# Patient Record
Sex: Female | Born: 1940 | Race: Black or African American | Hispanic: No | Smoking: Never smoker
Health system: Southern US, Community
[De-identification: ages and names within clinical notes are randomized; demographics above are authoritative.]

## PROBLEM LIST (undated history)

## (undated) DIAGNOSIS — E039 Hypothyroidism, unspecified: Secondary | ICD-10-CM

## (undated) DIAGNOSIS — M797 Fibromyalgia: Secondary | ICD-10-CM

## (undated) DIAGNOSIS — I10 Essential (primary) hypertension: Secondary | ICD-10-CM

## (undated) DIAGNOSIS — E785 Hyperlipidemia, unspecified: Secondary | ICD-10-CM

## (undated) HISTORY — PX: COLON SURGERY: SHX602

## (undated) HISTORY — PX: BACK SURGERY: SHX140

## (undated) HISTORY — PX: THYROIDECTOMY: SHX17

## (undated) HISTORY — PX: ABDOMINAL HYSTERECTOMY: SHX81

## (undated) HISTORY — PX: CARPAL TUNNEL RELEASE: SHX101

---

## 1997-03-23 ENCOUNTER — Ambulatory Visit (HOSPITAL_COMMUNITY): Admission: RE | Admit: 1997-03-23 | Discharge: 1997-03-23 | Payer: Self-pay | Admitting: *Deleted

## 1997-05-01 ENCOUNTER — Other Ambulatory Visit: Admission: RE | Admit: 1997-05-01 | Discharge: 1997-05-01 | Payer: Self-pay | Admitting: Obstetrics and Gynecology

## 1997-06-02 ENCOUNTER — Ambulatory Visit (HOSPITAL_COMMUNITY): Admission: RE | Admit: 1997-06-02 | Discharge: 1997-06-02 | Payer: Self-pay | Admitting: *Deleted

## 1997-12-12 ENCOUNTER — Encounter: Payer: Self-pay | Admitting: Neurosurgery

## 1997-12-14 ENCOUNTER — Inpatient Hospital Stay (HOSPITAL_COMMUNITY): Admission: RE | Admit: 1997-12-14 | Discharge: 1997-12-18 | Payer: Self-pay | Admitting: Neurosurgery

## 1997-12-14 ENCOUNTER — Encounter: Payer: Self-pay | Admitting: Neurosurgery

## 1998-02-13 ENCOUNTER — Ambulatory Visit (HOSPITAL_COMMUNITY): Admission: RE | Admit: 1998-02-13 | Discharge: 1998-02-13 | Payer: Self-pay | Admitting: *Deleted

## 1998-02-19 ENCOUNTER — Ambulatory Visit (HOSPITAL_COMMUNITY): Admission: RE | Admit: 1998-02-19 | Discharge: 1998-02-19 | Payer: Self-pay | Admitting: *Deleted

## 1998-02-19 ENCOUNTER — Encounter: Payer: Self-pay | Admitting: *Deleted

## 1998-03-27 ENCOUNTER — Encounter: Payer: Self-pay | Admitting: Obstetrics and Gynecology

## 1998-03-27 ENCOUNTER — Ambulatory Visit (HOSPITAL_COMMUNITY): Admission: RE | Admit: 1998-03-27 | Discharge: 1998-03-27 | Payer: Self-pay | Admitting: Obstetrics and Gynecology

## 1998-04-11 ENCOUNTER — Encounter: Payer: Self-pay | Admitting: *Deleted

## 1998-04-11 ENCOUNTER — Ambulatory Visit (HOSPITAL_COMMUNITY): Admission: RE | Admit: 1998-04-11 | Discharge: 1998-04-11 | Payer: Self-pay | Admitting: *Deleted

## 1998-07-16 ENCOUNTER — Encounter: Payer: Self-pay | Admitting: *Deleted

## 1998-07-16 ENCOUNTER — Ambulatory Visit (HOSPITAL_COMMUNITY): Admission: RE | Admit: 1998-07-16 | Discharge: 1998-07-16 | Payer: Self-pay | Admitting: *Deleted

## 1998-07-27 ENCOUNTER — Ambulatory Visit (HOSPITAL_COMMUNITY): Admission: RE | Admit: 1998-07-27 | Discharge: 1998-07-27 | Payer: Self-pay | Admitting: *Deleted

## 1998-09-03 ENCOUNTER — Encounter: Payer: Self-pay | Admitting: Emergency Medicine

## 1998-09-03 ENCOUNTER — Emergency Department (HOSPITAL_COMMUNITY): Admission: EM | Admit: 1998-09-03 | Discharge: 1998-09-03 | Payer: Self-pay | Admitting: Emergency Medicine

## 1998-09-07 ENCOUNTER — Ambulatory Visit (HOSPITAL_COMMUNITY): Admission: RE | Admit: 1998-09-07 | Discharge: 1998-09-07 | Payer: Self-pay | Admitting: *Deleted

## 1998-09-10 ENCOUNTER — Encounter: Payer: Self-pay | Admitting: *Deleted

## 1998-09-10 ENCOUNTER — Ambulatory Visit (HOSPITAL_COMMUNITY): Admission: RE | Admit: 1998-09-10 | Discharge: 1998-09-10 | Payer: Self-pay | Admitting: *Deleted

## 1998-10-09 ENCOUNTER — Ambulatory Visit (HOSPITAL_COMMUNITY): Admission: RE | Admit: 1998-10-09 | Discharge: 1998-10-09 | Payer: Self-pay | Admitting: *Deleted

## 2000-02-05 ENCOUNTER — Encounter: Payer: Self-pay | Admitting: Family Medicine

## 2000-02-05 ENCOUNTER — Ambulatory Visit (HOSPITAL_COMMUNITY): Admission: RE | Admit: 2000-02-05 | Discharge: 2000-02-05 | Payer: Self-pay | Admitting: Family Medicine

## 2000-02-19 ENCOUNTER — Encounter: Payer: Self-pay | Admitting: Family Medicine

## 2000-02-19 ENCOUNTER — Ambulatory Visit (HOSPITAL_COMMUNITY): Admission: RE | Admit: 2000-02-19 | Discharge: 2000-02-19 | Payer: Self-pay | Admitting: Family Medicine

## 2000-03-04 ENCOUNTER — Encounter: Payer: Self-pay | Admitting: Family Medicine

## 2000-03-04 ENCOUNTER — Ambulatory Visit (HOSPITAL_COMMUNITY): Admission: RE | Admit: 2000-03-04 | Discharge: 2000-03-04 | Payer: Self-pay | Admitting: Family Medicine

## 2000-03-24 ENCOUNTER — Encounter: Admission: RE | Admit: 2000-03-24 | Discharge: 2000-03-24 | Payer: Self-pay | Admitting: Cardiology

## 2000-03-24 ENCOUNTER — Encounter: Payer: Self-pay | Admitting: Cardiology

## 2000-05-02 ENCOUNTER — Encounter: Payer: Self-pay | Admitting: Emergency Medicine

## 2000-05-02 ENCOUNTER — Emergency Department (HOSPITAL_COMMUNITY): Admission: EM | Admit: 2000-05-02 | Discharge: 2000-05-02 | Payer: Self-pay | Admitting: Emergency Medicine

## 2000-07-21 ENCOUNTER — Emergency Department (HOSPITAL_COMMUNITY): Admission: EM | Admit: 2000-07-21 | Discharge: 2000-07-21 | Payer: Self-pay | Admitting: Emergency Medicine

## 2000-07-21 ENCOUNTER — Encounter: Payer: Self-pay | Admitting: Emergency Medicine

## 2000-09-04 ENCOUNTER — Ambulatory Visit (HOSPITAL_COMMUNITY): Admission: RE | Admit: 2000-09-04 | Discharge: 2000-09-04 | Payer: Self-pay | Admitting: *Deleted

## 2000-09-08 ENCOUNTER — Ambulatory Visit (HOSPITAL_COMMUNITY): Admission: RE | Admit: 2000-09-08 | Discharge: 2000-09-08 | Payer: Self-pay | Admitting: *Deleted

## 2000-12-20 ENCOUNTER — Encounter: Payer: Self-pay | Admitting: Emergency Medicine

## 2000-12-20 ENCOUNTER — Inpatient Hospital Stay (HOSPITAL_COMMUNITY): Admission: EM | Admit: 2000-12-20 | Discharge: 2000-12-25 | Payer: Self-pay | Admitting: Emergency Medicine

## 2001-05-19 ENCOUNTER — Encounter (HOSPITAL_BASED_OUTPATIENT_CLINIC_OR_DEPARTMENT_OTHER): Payer: Self-pay | Admitting: General Surgery

## 2001-05-19 ENCOUNTER — Ambulatory Visit (HOSPITAL_COMMUNITY): Admission: RE | Admit: 2001-05-19 | Discharge: 2001-05-19 | Payer: Self-pay | Admitting: General Surgery

## 2001-07-12 ENCOUNTER — Inpatient Hospital Stay (HOSPITAL_COMMUNITY): Admission: RE | Admit: 2001-07-12 | Discharge: 2001-07-22 | Payer: Self-pay | Admitting: General Surgery

## 2001-07-12 ENCOUNTER — Encounter (INDEPENDENT_AMBULATORY_CARE_PROVIDER_SITE_OTHER): Payer: Self-pay | Admitting: Specialist

## 2001-07-12 ENCOUNTER — Encounter (HOSPITAL_BASED_OUTPATIENT_CLINIC_OR_DEPARTMENT_OTHER): Payer: Self-pay | Admitting: General Surgery

## 2001-10-15 ENCOUNTER — Ambulatory Visit (HOSPITAL_COMMUNITY): Admission: RE | Admit: 2001-10-15 | Discharge: 2001-10-15 | Payer: Self-pay | Admitting: Cardiology

## 2001-10-15 ENCOUNTER — Encounter: Payer: Self-pay | Admitting: Cardiology

## 2001-12-04 ENCOUNTER — Emergency Department (HOSPITAL_COMMUNITY): Admission: EM | Admit: 2001-12-04 | Discharge: 2001-12-04 | Payer: Self-pay

## 2001-12-20 ENCOUNTER — Encounter: Payer: Self-pay | Admitting: Cardiology

## 2001-12-20 ENCOUNTER — Encounter: Admission: RE | Admit: 2001-12-20 | Discharge: 2001-12-20 | Payer: Self-pay | Admitting: Cardiology

## 2002-07-29 ENCOUNTER — Encounter: Payer: Self-pay | Admitting: Cardiology

## 2002-07-29 ENCOUNTER — Ambulatory Visit (HOSPITAL_COMMUNITY): Admission: RE | Admit: 2002-07-29 | Discharge: 2002-07-29 | Payer: Self-pay | Admitting: Cardiology

## 2002-08-09 ENCOUNTER — Ambulatory Visit (HOSPITAL_COMMUNITY): Admission: RE | Admit: 2002-08-09 | Discharge: 2002-08-09 | Payer: Self-pay | Admitting: Cardiology

## 2002-10-16 ENCOUNTER — Encounter: Payer: Self-pay | Admitting: Emergency Medicine

## 2002-10-16 ENCOUNTER — Emergency Department (HOSPITAL_COMMUNITY): Admission: EM | Admit: 2002-10-16 | Discharge: 2002-10-16 | Payer: Self-pay | Admitting: Emergency Medicine

## 2002-11-07 ENCOUNTER — Ambulatory Visit (HOSPITAL_COMMUNITY): Admission: RE | Admit: 2002-11-07 | Discharge: 2002-11-07 | Payer: Self-pay | Admitting: Urology

## 2002-11-07 ENCOUNTER — Encounter: Payer: Self-pay | Admitting: Urology

## 2003-05-22 ENCOUNTER — Encounter: Admission: RE | Admit: 2003-05-22 | Discharge: 2003-05-22 | Payer: Self-pay | Admitting: Cardiology

## 2003-05-24 ENCOUNTER — Encounter: Admission: RE | Admit: 2003-05-24 | Discharge: 2003-05-24 | Payer: Self-pay | Admitting: *Deleted

## 2003-10-04 ENCOUNTER — Encounter: Admission: RE | Admit: 2003-10-04 | Discharge: 2003-10-04 | Payer: Self-pay | Admitting: Cardiology

## 2003-10-06 ENCOUNTER — Emergency Department (HOSPITAL_COMMUNITY): Admission: EM | Admit: 2003-10-06 | Discharge: 2003-10-06 | Payer: Self-pay | Admitting: Emergency Medicine

## 2004-06-20 ENCOUNTER — Encounter: Admission: RE | Admit: 2004-06-20 | Discharge: 2004-06-20 | Payer: Self-pay | Admitting: Cardiology

## 2004-07-11 ENCOUNTER — Ambulatory Visit (HOSPITAL_COMMUNITY): Admission: RE | Admit: 2004-07-11 | Discharge: 2004-07-11 | Payer: Self-pay | Admitting: Gastroenterology

## 2004-10-09 ENCOUNTER — Encounter: Payer: Self-pay | Admitting: Cardiology

## 2004-10-09 ENCOUNTER — Observation Stay (HOSPITAL_COMMUNITY): Admission: EM | Admit: 2004-10-09 | Discharge: 2004-10-11 | Payer: Self-pay | Admitting: Emergency Medicine

## 2005-03-19 ENCOUNTER — Emergency Department (HOSPITAL_COMMUNITY): Admission: EM | Admit: 2005-03-19 | Discharge: 2005-03-19 | Payer: Self-pay | Admitting: Emergency Medicine

## 2005-07-31 ENCOUNTER — Encounter: Admission: RE | Admit: 2005-07-31 | Discharge: 2005-07-31 | Payer: Self-pay | Admitting: Cardiology

## 2005-10-07 ENCOUNTER — Encounter: Admission: RE | Admit: 2005-10-07 | Discharge: 2005-10-07 | Payer: Self-pay | Admitting: Cardiology

## 2005-10-20 ENCOUNTER — Encounter (HOSPITAL_COMMUNITY): Admission: RE | Admit: 2005-10-20 | Discharge: 2005-10-23 | Payer: Self-pay | Admitting: Cardiology

## 2005-11-11 ENCOUNTER — Ambulatory Visit (HOSPITAL_COMMUNITY): Admission: RE | Admit: 2005-11-11 | Discharge: 2005-11-11 | Payer: Self-pay | Admitting: Cardiology

## 2006-02-14 ENCOUNTER — Encounter: Admission: RE | Admit: 2006-02-14 | Discharge: 2006-02-14 | Payer: Self-pay | Admitting: Family Medicine

## 2006-03-10 ENCOUNTER — Ambulatory Visit (HOSPITAL_COMMUNITY): Admission: RE | Admit: 2006-03-10 | Discharge: 2006-03-11 | Payer: Self-pay | Admitting: Orthopaedic Surgery

## 2006-04-28 ENCOUNTER — Emergency Department (HOSPITAL_COMMUNITY): Admission: EM | Admit: 2006-04-28 | Discharge: 2006-04-28 | Payer: Self-pay | Admitting: Emergency Medicine

## 2006-08-13 ENCOUNTER — Encounter: Admission: RE | Admit: 2006-08-13 | Discharge: 2006-08-13 | Payer: Self-pay | Admitting: Cardiology

## 2007-02-06 ENCOUNTER — Emergency Department (HOSPITAL_COMMUNITY): Admission: EM | Admit: 2007-02-06 | Discharge: 2007-02-06 | Payer: Self-pay | Admitting: Emergency Medicine

## 2007-02-19 ENCOUNTER — Inpatient Hospital Stay (HOSPITAL_COMMUNITY): Admission: RE | Admit: 2007-02-19 | Discharge: 2007-02-24 | Payer: Self-pay | Admitting: Orthopaedic Surgery

## 2007-02-25 ENCOUNTER — Emergency Department (HOSPITAL_COMMUNITY): Admission: EM | Admit: 2007-02-25 | Discharge: 2007-02-25 | Payer: Self-pay | Admitting: Emergency Medicine

## 2007-07-29 ENCOUNTER — Emergency Department (HOSPITAL_COMMUNITY): Admission: EM | Admit: 2007-07-29 | Discharge: 2007-07-29 | Payer: Self-pay | Admitting: Emergency Medicine

## 2007-08-16 ENCOUNTER — Encounter: Admission: RE | Admit: 2007-08-16 | Discharge: 2007-08-16 | Payer: Self-pay | Admitting: Cardiology

## 2007-09-30 ENCOUNTER — Emergency Department (HOSPITAL_COMMUNITY): Admission: EM | Admit: 2007-09-30 | Discharge: 2007-09-30 | Payer: Self-pay | Admitting: Emergency Medicine

## 2007-12-08 ENCOUNTER — Encounter: Admission: RE | Admit: 2007-12-08 | Discharge: 2007-12-08 | Payer: Self-pay | Admitting: Cardiology

## 2008-08-16 ENCOUNTER — Encounter: Admission: RE | Admit: 2008-08-16 | Discharge: 2008-08-16 | Payer: Self-pay | Admitting: Cardiology

## 2009-03-06 ENCOUNTER — Ambulatory Visit (HOSPITAL_COMMUNITY): Admission: RE | Admit: 2009-03-06 | Discharge: 2009-03-06 | Payer: Self-pay | Admitting: Orthopaedic Surgery

## 2009-08-27 ENCOUNTER — Encounter: Admission: RE | Admit: 2009-08-27 | Discharge: 2009-08-27 | Payer: Self-pay | Admitting: Cardiology

## 2010-06-17 ENCOUNTER — Ambulatory Visit
Admission: RE | Admit: 2010-06-17 | Discharge: 2010-06-17 | Disposition: A | Discharge status: Home / Self Care | Payer: Medicare Other | Source: Ambulatory Visit | Attending: Cardiology | Admitting: Cardiology

## 2010-06-17 ENCOUNTER — Other Ambulatory Visit: Payer: Self-pay | Admitting: Cardiology

## 2010-06-17 DIAGNOSIS — R0602 Shortness of breath: Secondary | ICD-10-CM

## 2010-06-22 ENCOUNTER — Inpatient Hospital Stay (HOSPITAL_COMMUNITY)
Admission: EM | Admit: 2010-06-22 | Discharge: 2010-06-28 | DRG: 195 | Disposition: A | Discharge status: Home / Self Care | Payer: Medicare Other | Attending: Cardiology | Admitting: Cardiology

## 2010-06-22 ENCOUNTER — Emergency Department (HOSPITAL_COMMUNITY): Payer: Medicare Other

## 2010-06-22 DIAGNOSIS — E119 Type 2 diabetes mellitus without complications: Secondary | ICD-10-CM | POA: Diagnosis present

## 2010-06-22 DIAGNOSIS — J189 Pneumonia, unspecified organism: Principal | ICD-10-CM | POA: Diagnosis present

## 2010-06-22 DIAGNOSIS — Z7902 Long term (current) use of antithrombotics/antiplatelets: Secondary | ICD-10-CM

## 2010-06-22 DIAGNOSIS — D638 Anemia in other chronic diseases classified elsewhere: Secondary | ICD-10-CM | POA: Diagnosis present

## 2010-06-22 DIAGNOSIS — N189 Chronic kidney disease, unspecified: Secondary | ICD-10-CM | POA: Diagnosis present

## 2010-06-22 DIAGNOSIS — D509 Iron deficiency anemia, unspecified: Secondary | ICD-10-CM | POA: Diagnosis present

## 2010-06-22 DIAGNOSIS — I2510 Atherosclerotic heart disease of native coronary artery without angina pectoris: Secondary | ICD-10-CM | POA: Diagnosis present

## 2010-06-22 DIAGNOSIS — Z79899 Other long term (current) drug therapy: Secondary | ICD-10-CM

## 2010-06-22 DIAGNOSIS — D869 Sarcoidosis, unspecified: Secondary | ICD-10-CM | POA: Diagnosis present

## 2010-06-22 LAB — DIFFERENTIAL
Eosinophils Absolute: 0.1 10*3/uL (ref 0.0–0.7)
Eosinophils Relative: 1 % (ref 0–5)
Lymphs Abs: 3.6 10*3/uL (ref 0.7–4.0)
Monocytes Absolute: 0.6 10*3/uL (ref 0.1–1.0)
Monocytes Relative: 6 % (ref 3–12)

## 2010-06-22 LAB — CBC
HCT: 34.1 % — ABNORMAL LOW (ref 36.0–46.0)
MCH: 26.2 pg (ref 26.0–34.0)
MCV: 83.4 fL (ref 78.0–100.0)
Platelets: 318 10*3/uL (ref 150–400)
RDW: 15.8 % — ABNORMAL HIGH (ref 11.5–15.5)

## 2010-06-22 LAB — BASIC METABOLIC PANEL
BUN: 16 mg/dL (ref 6–23)
CO2: 28 mEq/L (ref 19–32)
Chloride: 103 mEq/L (ref 96–112)
Creatinine, Ser: 1.08 mg/dL (ref 0.4–1.2)
Potassium: 4 mEq/L (ref 3.5–5.1)

## 2010-06-24 LAB — BASIC METABOLIC PANEL
BUN: 21 mg/dL (ref 6–23)
Chloride: 108 mEq/L (ref 96–112)
Creatinine, Ser: 0.91 mg/dL (ref 0.4–1.2)
Glucose, Bld: 101 mg/dL — ABNORMAL HIGH (ref 70–99)
Potassium: 3.6 mEq/L (ref 3.5–5.1)

## 2010-06-24 LAB — CBC
HCT: 31.3 % — ABNORMAL LOW (ref 36.0–46.0)
Hemoglobin: 9.6 g/dL — ABNORMAL LOW (ref 12.0–15.0)
MCHC: 30.7 g/dL (ref 30.0–36.0)
MCV: 82.8 fL (ref 78.0–100.0)
RDW: 16 % — ABNORMAL HIGH (ref 11.5–15.5)
WBC: 15.4 10*3/uL — ABNORMAL HIGH (ref 4.0–10.5)

## 2010-06-25 LAB — BASIC METABOLIC PANEL
BUN: 20 mg/dL (ref 6–23)
Calcium: 9 mg/dL (ref 8.4–10.5)
Creatinine, Ser: 0.88 mg/dL (ref 0.4–1.2)
GFR calc non Af Amer: >60 mL/min (ref >60)
Glucose, Bld: 91 mg/dL (ref 70–99)
Potassium: 3.8 mEq/L (ref 3.5–5.1)

## 2010-06-25 LAB — CBC
HCT: 31.7 % — ABNORMAL LOW (ref 36.0–46.0)
Hemoglobin: 9.9 g/dL — ABNORMAL LOW (ref 12.0–15.0)
MCH: 26.2 pg (ref 26.0–34.0)
MCHC: 31.2 g/dL (ref 30.0–36.0)
MCV: 83.9 fL (ref 78.0–100.0)
RDW: 16.3 % — ABNORMAL HIGH (ref 11.5–15.5)

## 2010-06-25 NOTE — Op Note (Signed)
Stephanie Mcdaniel, Stephanie Mcdaniel                ACCOUNT NO.:  [id number]   MEDICAL RECORD NO.:  [id number]          PATIENT TYPE:  INP   LOCATION:  5034                         FACILITY:  [facility]   PHYSICIAN:  Vanita Panda. Magnus Ivan, M.D.DATE OF BIRTH:  12-Jun-1940   DATE OF PROCEDURE:  02/19/2007  DATE OF DISCHARGE:                               OPERATIVE REPORT   PREOPERATIVE DIAGNOSIS:  Severe tricompartmental osteoarthritis, left  knee.   POSTOPERATIVE DIAGNOSIS:  Severe tricompartmental osteoarthritis, left  knee.   PROCEDURE:  Left total knee arthroplasty using computer navigation.   IMPLANTS:  DePuy rotating platform Sigma knee with size 3 femur, size 3  tibial tray, 15-mm polyethylene insert, 35-mm patellar button.   SURGEON:  Doneen Poisson, M.D.   ASSISTANT:  Maud Deed, P.A.-C.   ANESTHESIA:  1. Left femoral block.  2. General.   ANTIBIOTICS:  2 grams IV Ancef.   TOURNIQUET TIME:  1 hour and 30 minutes.   BLOOD LOSS:  250 mL.   COMPLICATIONS:  None.   INDICATIONS:  Briefly, Ms. Stephanie Mcdaniel is a 70 year old female that I have  followed for over a year now.  She has debilitating arthritis involving  her left knee.  We have tried anti-inflammatories and then a series of  injections in her knee.  It has gotten to where it was affecting her  activities of daily living, and she wished to proceed with total knee  arthroplasty.  The risks and benefits of this were explained to her in  length including the risk of blood clots and fatal PE.  She agreed to  proceed with surgery.   After informed consent was obtained, the appropriate left leg was  marked.  Femoral block was obtained by anesthesia.  She was then brought  to the operating room and placed supine on the operating table.  General  anesthesia was then obtained.  A nonsterile tourniquet was placed on her  upper left thigh.  Her leg was prepped and draped with DuraPrep and  sterile drape done to the foot.  Sterile  stockinette was placed as well.  Time out was called.  She was identified as the correct patient, correct  extremity.  I then used Esmarch to wrap out the leg and tourniquet was  inflated with 350 mL pressure.  A midline incision was then made and  centered directly over the patella and carried proximally and distally  down the level patellar to the tibial tubercle.  Then, a medial  parapatellar approach was taken, and significant infusion was  encountered in the knee.  There was bone on bone wear on the medial side  as well as significant osteophytes on the lateral side and the  patellofemoral joint as well.  The osteophytes were removed from the  femur and the tibia.  I excised the ACL and the medial and lateral  meniscus as well as the fat pad.  Once this was accomplished, I was able  to invert the patella and put the knee in flexed to extended position.  Next, I proceeded with a navigation portion of the case.  Two  guide pins  were inserted through separate small incisions in the tibia from  anterior medial to posterior lateral.  Two separate pins were also put  in the femur from anterior medial to posterior lateral.  The navigation  arrays were then placed in the tibia and femur so we could proceed with  the navigation portion of the case.  Selecting points over the tibia and  femur, the computer was able to generate a model for knee replacement.  Next, the knee was flexed, and using the tibial cutting guide, I was  able to cut 10 mm off the high side and 9 mm off the low side on the  tibia, and with computer irrigation, I was able to cut with the  appropriate neutral slope and neutral varus/valgus.  Once this was  accomplished, we proceeded with the femur.  First, a distal femur cut  was made using navigation.  Once this was done, anterior and posterior  cuts and chamfer cuts were able to be made.  A size 3 femur was  selected, and I made the femoral box cut and then placed the  trial  femoral component.  Next, the tibia was prepared, and a size 3 tibia was  selected.  We made the tibial cuts, and then the tibial and femoral  trials were placed.  We started with a 10-mm polyethylene insert and  then finally went up to 15-mm polyethylene insert for trial, and this  was found to be stable in varus and valgus as well as flexion extension.  Using the patellar cutting guide, I measured the patella at 25 mm, and I  was able to cut 16-mm off the patella and measured this as a 35-mm bone,  and this was drilled in place.  All trial components were then removed.  I was used pulsatile irrigation to clean the knee thoroughly.  Of note  during the case, we did use flexion extension blocks to balance the knee  in both flexion and extension.  This was confirmed using computer  navigation as well.  Again, soft tissue was removed from the knee, and  then we irrigated it thoroughly with pulsatile lavage solution.  Next,  the tibia and femur were dried, and the cement was mixed.  I was able to  cement the size 3 tibial tray followed by the size 3 femur.  I then  placed a 15-mm polyethylene insert and cemented the patellar button in  place.  Once the cement had dried, I put the knee through a range of  motion was found to be stable with no excessive anterior drawer sign and  no strain to varus and valgus stressing.  The medium Hemovac was then  placed deeply, and we closed the arthrotomy with interrupted #1 Vicryl  suture followed by 2-0 Vicryl in the subcutaneous tissue and staples on  the skin.  Xeroform followed by well-padded sterile dressing was  applied, and a knee immobilizer was placed.  The patient was awakened,  extubated and taken to recovery room in stable condition.  Of note, the  tourniquet was let down at 1 hour 30 minutes, and hemostasis was  obtained using electrocautery.  All final counts were correct.  There  were no complications noted.      Vanita Panda.  Magnus Ivan, M.D.  Electronically Signed     CYB/MEDQ  D:  02/19/2007  T:  02/20/2007  Job:  [id number]

## 2010-06-26 LAB — GLUCOSE, CAPILLARY: Glucose-Capillary: 114 mg/dL — ABNORMAL HIGH (ref 70–99)

## 2010-06-27 ENCOUNTER — Inpatient Hospital Stay (HOSPITAL_COMMUNITY): Payer: Medicare Other

## 2010-06-27 LAB — IRON AND TIBC
Saturation Ratios: 19 % — ABNORMAL LOW (ref 20–55)
UIBC: 216 ug/dL

## 2010-06-27 LAB — CBC
HCT: 29.2 % — ABNORMAL LOW (ref 36.0–46.0)
Hemoglobin: 9.1 g/dL — ABNORMAL LOW (ref 12.0–15.0)
MCH: 25.9 pg — ABNORMAL LOW (ref 26.0–34.0)
MCHC: 31.2 g/dL (ref 30.0–36.0)
MCV: 83 fL (ref 78.0–100.0)
RBC: 3.52 MIL/uL — ABNORMAL LOW (ref 3.87–5.11)

## 2010-06-27 LAB — FERRITIN: Ferritin: 256 ng/mL (ref 10–291)

## 2010-06-27 LAB — BASIC METABOLIC PANEL
BUN: 18 mg/dL (ref 6–23)
Chloride: 111 mEq/L (ref 96–112)
Glucose, Bld: 97 mg/dL (ref 70–99)
Potassium: 3.5 mEq/L (ref 3.5–5.1)

## 2010-06-27 LAB — FOLATE: Folate: 8.3 ng/mL

## 2010-06-28 NOTE — Discharge Summary (Signed)
[facility]  Patient:    Stephanie Mcdaniel, Stephanie Mcdaniel Visit Number: [id number] MRN: [id number]          Service Type: MED Location: 3W [phone] Attending Physician:  Shelba Flake Dictated by:   Eduardo Osier Sharyn Lull, M.D. Admit Date:  12/20/2000 Disc. Date: 12/25/00   CC:         Sharyn Dross., M.D.   Discharge Summary  ADMITTING DIAGNOSES:          1. Left lower quadrant abdominal pain.                               2. Acute diverticulitis.                               3. Hypertension.                               4. Coronary artery disease.                               5. Hypercholesterolemia.                               6. Gastroesophageal reflux disease.                               7. History of bronchial asthma.                               8. Morbid obesity.                               9. History of diverticulosis in the past.  FINAL DIAGNOSES:              1. Resolving acute diverticulitis.                               2. Hypertension.                               3. Hypercholesterolemia.                               4. Gastroesophageal reflux disease.                               5. History of bronchial asthma.                               6. History of pulmonary embolism in the past.                               7. Morbid obesity.  8. History of diverticulosis.                               9. Mild coronary artery disease.  DISCHARGE HOME MEDICATIONS:   1. Toprol XL 25 mg one half tablet daily.                               2. Norvasc 5 mg one tablet daily.                               3. Cipro 500 mg one tablet twice daily for                                  10 days.                               4. Nexium 40 mg one capsule daily one half hour                                  before breakfast.                               5. Levbid extended-release tablets one tablet        twice daily.  ACTIVITY:                     As tolerated.  DIET:                         Low salt, low cholesterol, high fiber diet.  FOLLOWUP:                     Follow up with me in one week and Dr. Tad Moore in two weeks.  The patient will be scheduled for colonoscopy as outpatient in three to four weeks.  CONDITION AT DISCHARGE:       Stable.  HISTORY OF PRESENT ILLNESS:   The patient is a 70 year old black female with past medical history significant for hypertension, mild coronary artery disease, hypercholesterolemia, history of bronchial asthma, morbid obesity, history of diverticulosis, GERD admitted by Dr. Algie Coffer on December 20, 2000 because of left lower quadrant abdominal pain which was localized and nonradiating associated with nausea and low-grade fevers.  The patient denies any urinary complaints.  Denies any chest pain or shortness of breath.  PAST MEDICAL HISTORY:         As above.  PAST SURGICAL HISTORY:        She had hysterectomy many years ago.  MEDICATIONS AT HOME:          She was on Norvasc and Toprol.  ALLERGIES:                    She is allergic to IV DYE and MOTRIN.  SOCIAL HISTORY:               She is widowed for seven years; husband died with lung cancer.  She has three sons and one daughter.  She is disabled  from back pain.  FAMILY HISTORY:               Negative for coronary artery disease.  PHYSICAL EXAMINATION:  VITAL SIGNS:                  Blood pressure was 131/74, pulse was 84, temperature was 100.9.  HEENT:                        Conjunctivae were pink.  PERRLA.  Extraocular muscles were intact.  NECK:                         No JVD, no bruits.  LUNGS:                        Clear to auscultation.  CARDIOVASCULAR:               S1, S2 were normal.  ABDOMEN:                      Soft, there was left lower quadrant tenderness.  EXTREMITIES:                  There was no clubbing, cyanosis, or edema.  NEUROLOGIC:                    Grossly intact.  LABORATORIES:                 She had CT of the abdomen and pelvis that showed findings were consistent for sigmoid and descending colon diverticulitis without evidence for any abscess.  Blood cultures were negative.  Her hemoglobin was 10.9, hematocrit 32.5, white count of 10.3.  Repeat CBC of December 24, 2000, hemoglobin was 10.2, hematocrit 31.5, white count had come down to 6.1 with no shift to the left.  Her sodium was 139, potassium 3.3, chloride 106, bicarb 29, glucose 98, BUN 11, creatinine 0.9.  Repeat electrolytes on December 24, 2000, sodium 140, potassium 4.2, chloride 108, bicarb 27, glucose 98, BUN 10, creatinine 1.0.  BRIEF HOSPITAL COURSE:        The patient was admitted to regular floor, was started on IV Tequin and IV Flagyl was added next day.  The patient remained afebrile during the hospital stay and her white count gradually came down to its normal range.  The patient did not have any episodes of fever during the hospital stay.  The patient continued to have mild abdominal discomfort in the right and the left lower quadrant.  IV antibiotics were stopped yesterday and was started on p.o. Cipro which she is tolerating well.  The patient remained afebrile.  GI consultation was called with Dr. Tad Moore and the patient will be followed up by Dr. Tad Moore in two weeks and will be scheduled for colonoscopy in three to four weeks.   Dictated by:   Eduardo Osier Sharyn Lull, M.D. Attending Physician:  Shelba Flake DD:  12/25/00 TD:  12/25/00 Job: [id number]

## 2010-06-28 NOTE — Discharge Summary (Signed)
[place]  Patient:    Stephanie Mcdaniel, Stephanie Mcdaniel Visit Number: [id number] MRN: [id number]          Service Type: SUR Location: [id number] Attending Physician:  Sonda Primes Dictated by:   Mardene Celeste Lurene Shadow, M.D. Admit Date:  07/12/2001 Discharge Date: 07/22/2001                             Discharge Summary  ADMITTING DIAGNOSES: 1. Chronic diverticulosis and diverticulitis. 2. History of pulmonary embolism and deep vein thrombosis. 3. Hypertension. 4. Exogenous obesity.  DISCHARGE DIAGNOSES: 1. Chronic diverticulosis with diverticulitis and abscess formation. 2. History of pulmonary embolism. 3. Hypertension. 4. Obesity.  PROCEDURES IN THE HOSPITAL:  Routine bowel prep followed by left-sided hemicolectomy through a lower loop colorectal anastomosis.  COMPLICATIONS:  None.  CONDITION ON DISCHARGE:  Improved.  HISTORY OF PRESENT ILLNESS:  This patient is a 70 year old woman admitted with chronic left lower quadrant abdominal pain which has been intractable to antibiotics and antispasmodics.  CT scans and colonoscopies have on several occasions documented severe diverticular disease.  No other problems have been noted within the GI tract.  She was brought to the hospital and underwent preoperative evaluation and taken to the operating room following evaluation on July 13, 2001, for a left hemicolectomy.  Postoperative course was complicated by a mild postoperative ileus which has now resolved.  She was also treated with full dose heparinization as prophylaxis against DVT and pulmonary embolism.  The remainder of her postoperative course has been benign.  The heparin was discontinued and she was started on Lovenox within the last three days of her hospital admission.  At time of discharge, her wounds were healing satisfactorily.  She was tolerating a regular diet, and her left lower quadrant pain is now resolved.  She is being  discharged to be followed up in the office in two weeks.  DISCHARGE MEDICATIONS: 1. Vicodin one to two every four hours p.r.n. for pain. 2. Mycostatin powder to treat a fungal infection under a large abdominal    panniculus.  She is to continue on her usual medications; 1. Lotrel 10 mg b.i.d. 2. Toprol 50 mg b.i.d. 3. Avalide at 12.5/300 mg q.d.  ACTIVITY:  As tolerated.  DIET:  Unrestricted.Dictated by:   Mardene Celeste. Lurene Shadow, M.D. Attending Physician:  Sonda Primes DD:  07/22/01 TD:  07/24/01 Job: [id number]

## 2010-06-28 NOTE — Cardiovascular Report (Signed)
NAME:  Stephanie Mcdaniel, Stephanie Mcdaniel                ACCOUNT NO.:  [id number]   MEDICAL RECORD NO.:  [id number]          PATIENT TYPE:  OIB   LOCATION:  NA                           FACILITY:  [facility]   PHYSICIAN:  Mohan N. Sharyn Lull, M.D. DATE OF BIRTH:  18-May-1940   DATE OF PROCEDURE:  11/11/2005  DATE OF DISCHARGE:                              CARDIAC CATHETERIZATION   PROCEDURE:  Left cardiac cath with selective left and right coronary  angiography, left ventriculography via right groin using Judkins technique.   INDICATIONS FOR PROCEDURE:  Ms. Criger is a 70 year old black female with  past medical history significant for coronary artery disease, hypertension,  hypercholesteremia, remote history of tobacco abuse, morbid obesity,  positive family history of coronary artery disease.  Complains of  retrosternal chest pain off and on associated with feeling weak and dizzy,  lasting a few minutes.  Denies any palpitation, lightheadedness or syncope.  Denies nausea, vomiting, diaphoresis.  Denies PND, orthopnea, leg swelling.  Also complains of exertional dyspnea with minimal exertion.  The patient  underwent Persantine Myoview which showed mild inferior wall ischemia with  normal EF.   PAST MEDICAL HISTORY:  As above.   PAST SURGICAL HISTORY:  1. Status post parathyroidectomy.  2. Status post hysterectomy.  3. Status post tonsillectomy.  4. Status post bilateral carpal tunnel surgery.  5. Status post back surgery in the past.   ALLERGIES:  She is allergic to ASPIRIN, MOTRIN and IV CONTRAST.   MEDICATION AT HOME:  1. Toprol XL 25 mg p.o. daily.  2. Avalide 300/25 p.o. daily.  3. Caduet 10/40, one p.o. daily.  4. Plavix 75 mg p.o. daily.  5. Protonix 40 mg p.o. daily.   SOCIAL HISTORY:  She is widowed, has three children.  Smoked one pack per  week for 20+ years, quit in 20-25 years ago.  No history of alcohol abuse.   FAMILY HISTORY:  Positive for coronary artery disease.   PHYSICAL  EXAMINATION:  GENERAL APPEARANCE:  She is alert and oriented x3 in  no acute distress.  VITAL SIGNS:  Blood pressure was 140/84, pulse was 66 regular.  HEENT:  Conjunctivae was pink.  NECK:  Supple.  No JVD, no bruit.  LUNGS:  Clear to auscultation without rhonchi or rales.  CARDIOVASCULAR:  S1 and S2 was normal.  There was a soft systolic murmur.  There was no S3 gallop.  ABDOMEN:  Soft.  Bowel sounds present, obese, nontender.  EXTREMITIES:  There is no clubbing, cyanosis or edema.   IMPRESSION:  1. New onset angina.  2. Positive Persantine Myoview.  3. Hypertension.  4. Hypercholesteremia.  5. Remote history of tobacco abuse.  6. Morbid obesity.  7. Positive family history of coronary artery disease.   Discussed with the patient at length regarding left cath, possible  percutaneous transluminal coronary angioplasty stenting, its risks and  benefits; i.e. death, myocardial infarction, stroke, need for emergency  coronary artery bypass grafting, risk of restenosis, local vascular  complications.  Patient accepted and consented for the procedure.   PROCEDURE:  After obtaining informed consent the patient was  brought to the  cath lab and was placed on fluoroscopy table.  Right groin was prepped and  draped in usual fashion.  We used 2% Xylocaine for local anesthesia in the  right groin.  With the help of thin-wall needle, 6-French arterial sheath  was placed.  Sheath was aspirated and flushed.  Next, 6-French left Judkins  catheter was advanced over the wire under fluoroscopic guidance up to the  ascending aorta.  Wire was pulled out, the catheter was aspirated and  connected to the manifold.  Catheter was further advanced and engaged into  left coronary ostium.  Multiple views of the left system were taken.  Next,  the catheter was disengaged and was pulled out over the wire and was  replaced with 6-French right Judkins catheter which was advanced over the  wire under  fluoroscopic guidance up to the ascending aorta.  Wire was pulled  out, the catheter was aspirated and connected to the manifold.  Catheter was  further advanced and engaged into right coronary ostium.  Multiple views of  the right system were taken.  Next, the catheter was disengaged and was  pulled out over the wire and was replaced with 6-French pigtail catheter  which was advanced over the wire under fluoroscopic guidance up to the  ascending aorta.  Catheter was further advanced across the aortic valve into  LV.  LV pressures were recorded.  Next, left ventriculography was done in 30  degree RAO position.  Post angiographic pressures were recorded from LV and  then pullback pressures were recorded from aorta.  There was no significant  gradient across aortic valve.  Next, the pigtail catheter was pulled out  over the wire.  Sheaths were aspirated and flushed.   FINDINGS:  LV showed good LV systolic function, LVH, EF of 47-82%.  Left  main was very short, which was patent.  LAD has 10-15% proximal and mid  stenosis.  Diagonal I and II were very small.  Ramus was large which has 20-  30% proximal stenosis just prior to bifurcation.  Ramus bifurcates into  superior and inferior branch and superior branch is small, inferior branch  further bifurcates into superior and inferior branches distally.  Both the  branches are patent.  Left circumflex is small, which is patent proximally  and then tapers down in AV groove.  Left circumflex is diffusely diseased in  AV groove as before.  OM1 is very, very small.  RCA has 15-20% proximal  stenosis.  PDA is small which is patent.  PL branches are small, which were  patent.   The patient tolerated procedure well.  There are no complications.  The  patient was transferred to recovery room in stable condition.          ______________________________  Eduardo Osier Sharyn Lull, M.D.   MNH/MEDQ  D:  11/11/2005  T:  11/12/2005  Job:  [id number]

## 2010-06-28 NOTE — Discharge Summary (Signed)
Stephanie Mcdaniel, Stephanie Mcdaniel                ACCOUNT NO.:  [id number]   MEDICAL RECORD NO.:  [id number]          PATIENT TYPE:  INP   LOCATION:  1413                         FACILITY:  [facility]   PHYSICIAN:  Mohan N. Sharyn Lull, M.D. DATE OF BIRTH:  1940-08-27   DATE OF ADMISSION:  10/08/2004  DATE OF DISCHARGE:  10/11/2004                                 DISCHARGE SUMMARY   ADMISSION DIAGNOSES:  1.  Chest pain, rule out myocardial infarction.  2.  Marked sinus bradycardia secondary to beta blockers.  3.  Hypertension.  4.  Hypercholesterolemia.  5.  Morbid obesity.  6.  Positive family history of coronary artery disease.   DISCHARGE DIAGNOSES:  1.  Status post chest pain, myocardial infarction ruled out.  2.  Negative Persantine Myoview.  3.  Status post symptomatic bradycardia secondary to beta blockers.  4.  Hypertension.  5.  Hypercholesterolemia.  6.  Morbid obesity.  7.  Chronic anemia.  8.  Elevated blood sugars.  9.  Positive family history of coronary artery disease.   DISCHARGE MEDICATIONS:  1.  Plavix 75 mg 1 tablet daily with food.  2.  Ibuprofen 50 mg one tablet daily.  3.  Caduet 10/40 one tablet daily.  4.  Protonix 40 mg one tablet daily.  5.  Feosol 325 mg one tablet daily.   DIET:  Low salt, low cholesterol. Patient has been advised to reduce weight  and avoid sweets. Follow up with me in 1 week.   CONDITION ON DISCHARGE:  Stable.   ACTIVITY:  As tolerated.   BRIEF HISTORY AND HOSPITAL COURSE:  Stephanie Mcdaniel is a 70 year old black  female with past medical history significant for pulmonary artery disease,  hypertension, hypercholesterolemia, reported history of tobacco abuse,  morbid obesity, positive family history of coronary artery disease. She came  to the ER complaining of retrosternal chest pain off and on since 5 p.m.  associated with mild shortness of breath and dizziness. She received 3  sublingual nitroglycerin in the ER with relief of chest pain. Denies  any  palpitations, light headedness or syncope. Describes chest pain as pressure  rated as a 10 radiating to her left shoulder. Denies paroxysmal nocturnal  dyspnea, orthopnea, leg swelling. Denies fever or chills. Denies relation of  chest pain to food, breathing of movement. Denies any nausea, vomiting, but  felt diaphoretic.   PAST MEDICAL HISTORY:  As above.   PAST SURGICAL HISTORY:  She had a parathyroidectomy in the past.  Hysterectomy in the past. Tonsillectomy in the past. Bilateral carpal tunnel  surgery. Back surgery in the past.   ALLERGIES:  ASPIRIN, MOTRIN AND IV DYE.   MEDICATIONS AT HOME:  1.  Caduet 10/40.  2.  Toprol 50 mg p.o. daily.  3.  Avalide 300/12.5 daily.  4.  Plavix was recently stopped.   SOCIAL HISTORY:  She is widowed. Three children. Smoked one pack per week  for 20+ years, quit 20+ years ago. No history of alcohol abuse. She works  for [employer].   FAMILY HISTORY:  Father is alive at 3, has coronary  artery disease. Mother  is alive at 46. One sister in good health.   PHYSICAL EXAMINATION:  GENERAL:  She is alert and oriented x3, in no acute  distress.  VITAL SIGNS:  Blood pressure is 125/60, pulse 56 and regular.  HEENT:  Oropharynx pink.  NECK:  Supple, no JVD, no bruit.  LUNGS:  Decreased breath sounds at the bases.  HEART:  Regular S1, S2, normal. There is a soft systolic murmur.  ABDOMEN:  Soft, positive bowel sounds are present.  EXTREMITIES:  No clubbing, cyanosis or edema.   LABORATORY DATA:  EKG showed sinus bradycardia with a first degree AV block,  right bundle branch block with secondary ST, T wave changes. No acute  ischemic changes were noted. Her serum iron was 55, iron binding capacity  316, saturation was low at 17, ferritin level was 176. Her TSH was 3.655  (normal range). Cholesterol was 175, triglycerides 80, HDL 42, LDL was  slightly elevated to 117. Cardiac enzymes first set: CK was 114, MB 1.8.  second set: CK  was 192, MB 1.8 with an index of 0.9. Troponin I was 0.01.  Second set was also less than 0.01. BNP was 30. Sodium was 137, potassium  4.2, chloride 104, bicarb 24, glucose 100, repeat fasting blood sugar was  97, BUN 30, creatinine 1.5, repeat BUN 27, creatinine 1.1. Hemoglobin was  11.5, hematocrit 35.6, white count of 8700. Repeat hemoglobin was 10.6,  hematocrit 32, white count of 6000. Hemoglobin today is 10.5, hematocrit  31.5 which is stable. BUN is 14, creatinine 1.1, potassium 3.7, glucose is  high normal at 101.   BRIEF HOSPITAL COURSE:  The patient was admitted to a telemetry unit and MI  was ruled out by serial enzymes and EKG. Her beta blockers were held and  patient did not have any episodes of chest pain during the hospital stay.  The patient subsequently underwent Persantine Myoview which showed no  evidence of ischemia with a normal ejection fraction. The patient had an  episode of occasional dizziness and when bradycardiac, beta blockers were  held  and heart rate gradually improved into the 60's. The patient is asymptomatic  and feeling better after weaning off the beta blockers. The patient is  ambulating in the hallway without any problems. Blood pressure is fairly  well controlled. The patient will be discharged home on her medications and  will be followed up in my office in 1 week.           ______________________________  Eduardo Osier. Sharyn Lull, M.D.     MNH/MEDQ  D:  10/11/2004  T:  10/11/2004  Job:  [id number]

## 2010-06-28 NOTE — Cardiovascular Report (Signed)
NAME:  Stephanie Mcdaniel, Stephanie Mcdaniel                          ACCOUNT NO.:  [id number]   MEDICAL RECORD NO.:  [id number]                   PATIENT TYPE:  OIB   LOCATION:  2899                                 FACILITY:  [facility]   PHYSICIAN:  Eduardo Osier. Sharyn Lull, M.D.              DATE OF BIRTH:  1940-05-23   DATE OF PROCEDURE:  08/09/2002  DATE OF DISCHARGE:                              CARDIAC CATHETERIZATION   PROCEDURE:  Left cardiac catheterization with selective left and right  coronary angiography and left ventriculography via the right groin using  Judkins technique.   INDICATIONS:  Ms. Stephanie Mcdaniel is a 31 year old black female with past  medical history significant for hypertension, hypercholesterolemia,  gastroesophageal reflux disease, history of pulmonary embolism in the past,  morbid obesity, history of diverticulitis, complaints of retrosternal chest  tightness off and on radiating to the left shoulder without associated  symptoms.  The patient denies nausea, vomiting, diaphoresis.  Denies  palpation, lightheadedness or syncope.  The patient does give also history  of exertional dyspnea  but denies PND, orthopnea or leg swelling.  The  patient states her son recently died with massive myocardial infarction and  is really anxious.  The patient subsequently underwent Persantine Cardiolite  on July 29, 2002 which showed anterolateral wall ischemia extending from mid  portion to the apex with normal ejection fraction.  Due to chest pain,  positive Persantine Cardiolite and multiple risk factors the patient was  advised for cath and possible percutaneous transluminal coronary angioplasty  or stenting.   PAST MEDICAL HISTORY:  As above.   PAST SURGICAL HISTORY:  She had hysterectomy in 1976 and parathyroidectomy  in 1979.  Carpal tunnel surgery in 1990 and 1992.  Partial colectomy for  diverticulosis/diverticulitis recently.   ALLERGIES:  She is allergic to IV CONTRAST.   SOCIAL HISTORY:   She is widowed and has four children.  Presently, on  disability.  Worked as Personnel officer.  Smoked occasionally for many  years.  No history of alcohol abuse.   MEDICATIONS:  1. Toprol XL 50 mg p.o. daily.  2. Norvasc 10 mg p.o. daily.  3. Plavix 75 mg p.o. daily.  4. Lipitor 20 mg p.o. daily.  5. Avalide 300/12.5 mg p.o. daily.  6. Xanax 0.25 mg p.o. b.i.d.   PHYSICAL EXAMINATION:  GENERAL:  She was alert, awake, oriented x3 in no  acute distress.  VITAL SIGNS:  Blood pressure 140/82, pulse 50.  HEENT:  Conjunctivae pink.  NECK:  Supple.  No jugular venous distention.  No bruit.  LUNGS:  Clear to auscultation without rhonchi or rales.  CARDIOVASCULAR:  S1, S2 was normal.  No S3 gallop.  ABDOMEN:  Soft bowel sounds are present.  Obese.  Nontender.  EXTREMITIES:  No clubbing, cyanosis or edema.   IMPRESSION:  1. New onset angina.  2. Positive Persantine Cardiolite.  3. Hypertension.  4. Hypercholesterolemia.  5.  Morbid obesity.  6. Gastroesophageal reflux disease.  7. Diverticulitis.   PLAN:  Discussed with patient at length regarding left cath, possible  percutaneous transluminal coronary angioplasty, stenting and its risks; i.e.  that myocardial infarction, stroke, need for emergency coronary artery  bypass graft, risk of restenosis, local vascular complications, etc., and  consented for PCI.   PROCEDURE:  After obtaining informed consent, the patient was brought to the  cath lab and was placed on fluoroscopy table. Right groin was prepped and  draped in usual fashion.  2% Xylocaine was used for local anesthesia in the  right groin.  With the help of thin-wall needle, a 6-French arterial sheath  was placed.  The sheath was aspirated and flushed.  Next, 6-French left  Judkins catheter was advanced over the wire under fluoroscopic guidance up  to the ascending aorta.  Wire was pulled out and the catheter was aspirated  and connected to the manifold. Catheter was  further advanced and engaged  into left coronary ostium.  Multiple views of the left system were taken.  Next, the catheter was disengaged and was pulled out over the wire and was  replaced with 6-French right Judkins catheter which was advanced over the  wire under fluoroscopic guidance up to the ascending aorta.  Wire was pulled  out, the catheter was aspirated and connected to the manifold.  Catheter was  further advanced and engaged into right coronary ostium.  Multiple views of  the right system were taken.  Next, the catheter was disengaged and was  pulled out over the wire and was replaced with 6-French pigtail catheter  which was advanced over wire under fluoroscopic guidance up to the ascending  aorta.  Wire was pulled out, the catheter was aspirated and connected to the  manifold.  Catheter was further advanced across the aortic valve into the  left ventricle.  Left ventricular pressures were recorded.  Next, left  ventriculography was done in 30-degree RAO position.  Post angiographic  pressures were recorded from LV and then pulled back pressures were recorded  from the aorta.  There was no gradient across the aortic valve.  Next, the  pigtail catheter was pulled out over the wire, sheaths aspirated and  flushed.   FINDINGS:  The LV showed good LV systolic function.  EF of 60-65%.  Left  main was short and patent.  LAD has 10-20% proximal and mid stenosis.  Diagonal #1 and diagonal #2 were very, very small.  Ramus was large which  was patent which bifurcates into superior and inferior branches which  further bifurcates distally.  Left circumflex was small which was patent  proximally, but diffusely diseased in the AV groove.  OM-1 was very, very  small which was diffusely diseased.  RCA has 10-15% proximal stenosis.  Arteriogram was closed with Perclose without complications.  The patient tolerated the procedure well.  The patient was transferred to recovery room  in stable  condition.                                                Eduardo Osier. Sharyn Lull, M.D.    MNH/MEDQ  D:  08/09/2002  T:  08/09/2002  Job:  [id number]  Cath Lab   cc:   Cath Lab

## 2010-06-28 NOTE — H&P (Signed)
NAME:  Stephanie Mcdaniel, Stephanie Mcdaniel                ACCOUNT NO.:  [id number]   MEDICAL RECORD NO.:  [id number]          PATIENT TYPE:  EMS   LOCATION:  ED                           FACILITY:  [facility]   PHYSICIAN:  Alfonse Alpers. Gegick, M.D.DATE OF BIRTH:  1940-08-08   DATE OF ADMISSION:  03/19/2005  DATE OF DISCHARGE:                                HISTORY & PHYSICAL   CHIEF COMPLAINT:  This is a 70 year old woman who presents with a history of  symptoms of shortness of breath.   HISTORY OF PRESENT ILLNESS:  The patient was seen in the office 2 weeks ago,  and at that time, subsequent to that hospital office visit, she began having  symptoms of dyspnea.  These symptoms have increased over the period of the  last 2 weeks.  She presents now with a history of marked dyspnea.  In the  emergency room, she had an electrocardiogram which showed no acute changes.  She did have a chest x-ray, which was consistent with some pulmonary edema,  and a BNP was 514.  She had no history of significant chest pain preceding  this.  She did have some symptoms of dyspnea, and she also has had a history  of a cough productive of yellow sputum for the last 3-5 days.  In addition,  she has a history of chronic kidney disease.  Associated with that, she has  had anemia.  In addition to that, she had an episode of hematemesis  approximately 3-5 days prior to this admission.  There is no history of  chest pain.   She has a history of type 2 diabetes, and this has been treated with Amaryl,  and also with Actos.  Januvia has been started, and she has recently been  receiving this with improvement in her glucoses.   She has a history of chronic kidney disease with her hemoglobin levels being  approximately 2.1.  In the emergency room, she was noted to have a BUN of  44.   She has a history of cough productive of purulent sputum, which has been  present for the last 4 days.  Her white count on admission was only 8.7.   She has a  history of sarcoidosis.  She recently was in the hospital and this  was biopsied, and this diagnosis was made.   She has a history of arteriosclerotic heart disease, and she has been  scheduled in the past for elective carotid stents; however, this has not  been done recently.  In addition, she has had cerebrovascular accidents in  the past.   PAST MEDICAL HISTORY:  She has had multiple diseases, many of which are  mentioned above, and these include -  1.  Diabetes mellitus type 2.  2.  Congestive heart failure in the past.  3.  Arteriosclerotic heart disease.  4.  History of bronchitis and consistent with pneumonia most recently.  5.  History of anemia.  Her hemoglobin was previously 8.8, and now it is      9.0.  6.  History of sarcoidosis.   PERSONAL HISTORY:  She does not smoke or drink excessive amounts of alcohol.   ALLERGIES:  1.  GLUCOPHAGE causes extreme diarrhea.  2.  ALTACE.   REVIEW OF SYSTEMS:  Her weight has been gradually increasing.  CARDIOVASCULAR/RESPIRATORY:  See above.  GI:  See above.  GU:  No  complaints.   PHYSICAL EXAMINATION:  GENERAL:  This is a well-developed woman who appear  in no distress.  Her breathing rate is moderately rapid.  NECK:  Supple.  No bruits were noted.  LUNGS:  Some rales present bilaterally.  BREASTS:  No masses are present.  CARDIOVASCULAR:  Rhythm is regular.  ABDOMEN:  Soft.  Some mild tenderness in present in the left upper quadrants  of the abdomen.  No specific tenderness is present.  EXTREMITIES:  Trace to 1+ pedal edema.  NEUROMUSCULAR:  The patient is grossly intact.   IMPRESSION:  1.  Congestive heart failure.  2.  Bronchitis/pneumonia.  3.  Diabetes mellitus type 2.  4.  History of arteriosclerotic heart disease with cerebrovascular accidents      and atherosclerotic lesions in the heart.  5.  History of subclavian steel syndrome.  6.  History of esophageal reflux disease.  7.  Chronic kidney disease with renal  insufficiency with a creatinine of      approximately 2 to 2.1.  8.  History of depression, for which she has been taking Cymbalta.  9.  History of sarcoidosis.  10. History of dyslipidemia.           ______________________________  Alfonse Alpers. Dagoberto Ligas, M.D.     CGG/MEDQ  D:  03/19/2005  T:  03/19/2005  Job:  [id number]

## 2010-06-28 NOTE — Op Note (Signed)
NAME:  Stephanie Mcdaniel, Stephanie Mcdaniel                ACCOUNT NO.:  [id number]   MEDICAL RECORD NO.:  [id number]          PATIENT TYPE:  OIB   LOCATION:  5002                         FACILITY:  [facility]   PHYSICIAN:  Vanita Panda. Magnus Ivan, M.D.DATE OF BIRTH:  August 06, 1940   DATE OF PROCEDURE:  03/10/2006  DATE OF DISCHARGE:                               OPERATIVE REPORT   PREOPERATIVE DIAGNOSIS:  Right shoulder impingement syndrome.   POSTOPERATIVE DIAGNOSIS:  Right shoulder impingement syndrome.   PROCEDURE:  Right shoulder arthroscopy with extensive debridement and  subacromial decompression.   SURGEON:  Vanita Panda. Magnus Ivan, M.D.   ANESTHESIA:  1. Right shoulder scalene block.  2. General anesthesia.   BLOOD LOSS:  Minimal.   ANTIBIOTICS:  1 gram IV Ancef.   COMPLICATIONS:  None.   INDICATION:  Briefly, Stephanie Mcdaniel is a 70 year old obese female who has  been followed for some time for a painful right dominant shoulder.  It  continued be painful and weak to her and I tried anti-inflammatories,  injections and physical therapy.  She continued to have symptoms of  impingement.  I elected not to proceed with a MRI due to the fact that I  felt that if there was enough of a rotator cuff tear at the time  surgery, I would perform repair, given that this is her dominant  shoulder and she is a quite active individual in light of her morbid  obesity.  I explained the risks and benefits of surgery and felt we  should proceed with surgery as she had been having this pain for over a  year.   DESCRIPTION OF PROCEDURE:  After informed consent was obtained, the  appropriate right arm was marked.  An interscalene block was placed by  anesthesia.  She was brought to the operating room and placed supine on  the operating table.  General anesthesia was then obtained. She was then  positioned to a beach chair position with appropriate padding on her  down nonoperative left arm, appropriate positioning of  the head and  neck. Her right arm was then prepped and draped with DuraPrep and  sterile drapes and then placed in in-line skeletal traction using the  fishing pole and 10 pounds of weight.  Her arm was at 45 degrees of  flexion and neutral abduction, adduction and neutral rotation.   I then made a posterior portal just off the posterior lateral edge of  acromion two fingerbreadth distal and one fingerbreadth medial. The  glenohumeral space was entered. Right away I could see that there was  what appeared to be a SLAP lesion of at least a type 1. There was  degenerative tearing of the anterior labrum and just mild irregularity  of the undersurface of the rotator cuff.  An anterior portal was then  made just lateral to the coracoid process and an Arthrex soft tissue  ablation wand was inserted and I performed an extensive debridement in  the glenohumeral joint, especially the SLAP lesion. The biceps tendon,  itself, was intact.  The labrum was intact but I was able to debride  chronic frayed edges posteriorly and anteriorly. The subscapularis  tendon was intact, as well, and I did a debridement of partial under  surface tearing of the rotator cuff and inflamed synovitis type tissue  in the shoulder joint itself.   Next, I under sewed the subacromial space to the posterior portal and  then made a separate lateral portal.  There was a tremendous amount of  bursitis and tendinosus/tendinitis in the subacromial space.  I put in  an arthroscopic shaver and debrided the under surface of the acromion  and clavicle as well as a complete the bursectomy in the subacromial  space to free up tissue from the rotator cuff.  Once I freed up tissue  from the rotator cuff, this was probed and I could tell the rotator cuff  was intact.  I then continued to shave underneath the acromion and the  clavicle.  All instrumentation was then removed. The portal sites were  closed with interrupted 4-0 nylon  suture.  Xeroform followed by a well  padded sterile dressing was applied.  The patient's arm was placed in a  sling.  She was awakened, extubated, and taken to the recovery room in  stable condition.  All final counts were correct and there were no  complications noted.   Postoperatively, I will admit her overnight for observation and icing of  the shoulder and then have her wear a sling for about 2-3 days for  initiating continued physical therapy and anti-inflammatories.           ______________________________  Vanita Panda. Magnus Ivan, M.D.     CYB/MEDQ  D:  03/10/2006  T:  03/10/2006  Job:  [id number]

## 2010-06-28 NOTE — Discharge Summary (Signed)
Stephanie Mcdaniel, Stephanie Mcdaniel                ACCOUNT NO.:  [id number]   MEDICAL RECORD NO.:  [id number]          PATIENT TYPE:  INP   LOCATION:  5034                         FACILITY:  [facility]   PHYSICIAN:  Vanita Panda. Magnus Ivan, M.D.DATE OF BIRTH:  July 22, 1940   DATE OF ADMISSION:  02/19/2007  DATE OF DISCHARGE:  02/24/2007                               DISCHARGE SUMMARY   ADMISSION DIAGNOSIS:  Severe osteoarthritis/degenerative joint disease  left knee.   DISCHARGE DIAGNOSIS:  Severe degenerative joint disease/osteoarthritis  left knee.   PROCEDURE:  Left total knee arthroplasty on February 19, 2007.   HOSPITAL COURSE:  Briefly, Ms. Szymanowski is a 70 year old female with a  history of the debilitating pain involving her left knee.  I have tried  to temporize this with anti-inflammatory medications as well as  injections and physical therapy.  It has gotten to the point where it  affects her activities of daily living.  She wished to proceed with  total knee arthroplasty after risks and benefits of this were explained  to her and well understood.   She was taken to the operating room on the day of admission where she  underwent a left total knee arthroplasty without complications.  For  detailed description of the operation, please refer to the dictated  operative note in the patient's medical record.  Postoperatively, she  was admitted to a regular orthopedic floor bed and proceeded per  protocol with physical therapy, occupational therapy and Coumadin for  DVT prophylaxis.  By the day of discharge, she had at least a  transfusion for acute blood loss anemia, but otherwise was doing well.  She is being maintained on her Coumadin level.  She was tolerating oral  diet and regular pain medications.  Her knee incision was found to be  clean, dry, intact.  It was felt that she could be safely discharged to  home with home health physical therapy.  By the day of discharge,  incision was clean,  dry, intact.  She was afebrile with stable vital  signs.   DISPOSITION:  Home.   DISCHARGE INSTRUCTIONS:  1. While she is at home, she will continue to weight bear as tolerated      with physical therapy and occupational therapy.  2. The incision can be changed on a daily basis as needed.  3. She was discharged to home on oral pain medications as well as      muscle relaxants and Coumadin with instructions for      titrating/adjusting her Coumadin per draws at home by the home      health services.  4. Follow up with the office in 2 weeks.      Vanita Panda. Magnus Ivan, M.D.  Electronically Signed     CYB/MEDQ  D:  03/11/2007  T:  03/12/2007  Job:  [id number]

## 2010-06-28 NOTE — Op Note (Signed)
[place]  Patient:    DELAILA, NAND Visit Number: [id number] MRN: [id number]          Service Type: SUR Location: [id number] Attending Physician:  Sonda Primes Dictated by:   Mardene Celeste. Lurene Shadow, M.D. Proc. Date: 07/13/01 Admit Date:  07/12/2001   CC:         Sharyn Dross., M.D.   Operative Report  PREOPERATIVE DIAGNOSIS:  Diverticulosis with chronic intractable diverticulitis.  POSTOPERATIVE DIAGNOSIS:  Diverticulosis with chronic intractable diverticulitis.  OPERATION PERFORMED:  Left hemicolectomy and low colorectal anastomosis.  SURGEON:  Mardene Celeste. Lurene Shadow, M.D.  ASSISTANT:  Marnee Spring. Wiliam Ke, M.D.  ANESTHESIA:  General.  INDICATIONS FOR PROCEDURE:  The patient is a 70 year old female with longstanding left lower quadrant pain which has been persistent despite the use of antibiotics and antispasmodics.  She has been diagnosed on several occasions by CT scan, colonoscopy and barium enema to have extensive sigmoid and descending colon diverticulitis and diverticulosis.  She is brought to the operating room now for left-sided hemicolectomy after the risks and benefits of surgery had been fully discussed and she accepts these risks and gives her full consent.  DESCRIPTION OF PROCEDURE:  Following the induction of satisfactory general anesthesia with the patient positioned supinely, the abdomen was prepped routinely and draped.  A midline incision was made extending from the pubic tubercle up to and above the umbilicus into the epigastrium, deepened through the skin and subcutaneous tissues through the linea alba into the abdomen. The abdomen was widely opened and explored.  The liver surface was smooth, the liver edges were sharp.  Anterior gastric wall, duodenal sweep appeared to be normal.  None of the small intestine viewed appeared to be abnormal.  The right colon, appendix and transverse colon appeared to be normal.   There was large inflamed segment of sigmoid colon tucked into the pelvis and adhered to the pelvic sidewall and abdominal wall due to chronic diverticular change. This was dissected free, carrying the dissection across the pelvic brim down into the pelvis and the chronically inflamed proximal rectum and sigmoid colon was mobilized.  Mobilization carried up along the retroperitoneal reflection up to the splenic flexure.  We did not actually take down the splenic flexure but mobilized the colon so as to get sufficient of the descending colon down to anastomose to the rectum.  This having been accomplished, I used a GIA-75 staple transecting the proximal descending colon and the proximal rectum.  The intervening mesentery was then taken between clamps securing it with ties of 2-0 silk and 0 silk.  The specimen was removed in its entirety and forwarded for pathologic evaluation.  An end-to-end anastomosis in low anterior fashion was carried out by placing interrupted Lembert sutures on the posterior wall, then opening the colon proximally and distally having occluded the colon.  The prep was noted to be excellent.  A running interlocking posterior wall suture of 3-0 Vicryl was carried out and then continued on the anterior row as a Connell suture.  The anastomosis was then completed with interrupted Lembert sutures on the anterior wall.  The resulting anastomosis was noted to be widely patent.  The mesenteric defect and the pelvic opening were then closed with interrupted 3-0 silk sutures.  The entire abdominal cavity was then thoroughly irrigated with normal saline.  Sponge, instrument and sharp counts were verified.  The wound was then closed in layers as follows.  The midline closed with  a running suture of #1 Novofil, subcutaneous tissues thoroughly irrigated and skin closed with staples.  Sterile dressings were applied. Anesthetic reversed.  Patient removed from the operating room to the  recovery room in stable condition having tolerated the procedure well. Dictated by:   Mardene Celeste. Lurene Shadow, M.D. Attending Physician:  Sonda Primes DD:  07/13/01 TD:  07/14/01 Job: [id number]

## 2010-06-30 NOTE — Discharge Summary (Signed)
NAME:  Stephanie Mcdaniel, Stephanie Mcdaniel                ACCOUNT NO.:  [id number]  MEDICAL RECORD NO.:  [id number]           PATIENT TYPE:  I  LOCATION:  4739                         FACILITY:  [facility]  PHYSICIAN:  Qais Jowers N. Sharyn Lull, M.D. DATE OF BIRTH:  1940-05-25  DATE OF ADMISSION:  06/22/2010 DATE OF DISCHARGE:  06/28/2010                              DISCHARGE SUMMARY   ADMITTING DIAGNOSES:  Bilateral pneumonia, sarcoidosis, weakness, anemia of chronic disease.  DISCHARGE DIAGNOSES:  Resolving bilateral pneumonia, hypertension, chronic anemia, hypercholesteremia, morbid obesity, history of sarcoidosis.  DISCHARGE HOME MEDICATIONS: 1. Prednisone 10 mg 1 tablet daily for 5 days. 2. Avelox 400 mg 1 tablet daily. 3. Lipitor 40 mg 1 tablet daily. 4. Plavix 75 mg 1 tablet daily. 5. Protonix 40 mg 1 tablet daily. 6. Toprol-XL 100 mg 1 tablet daily. 7. Tribenzor 40/10/25, 1 tablet daily. 8. Vitamin C 500 mg 1 tablet twice daily as before.  DIET:  Low salt, low cholesterol.  ACTIVITY:  Increase activity slowly as tolerated.  Condition at discharge is stable.  Follow up with me in 1 week.  BRIEF HISTORY AND HOSPITAL COURSE:  Ms. Stephanie Mcdaniel is a 70 year old black female with past medical history significant for hypertension, history of sarcoidosis, morbid obesity, chronic anemia was admitted by Dr. Algie Coffer on May 12 because of cough, fever associated with shortness of breath.  The patient was seen in my office last week and was started on p.o. Avelox for pneumonia but as her breathing got worse, so decided to come to ER.  The patient had repeat chest x-ray done in the ED which showed slight improvement from prior chest x-ray.  PAST MEDICAL HISTORY:  As above.  PAST SURGICAL HISTORY:  She had hysterectomy in the past, tonsillectomy in the past, and bilateral carpal tunnel surgery in the past, had back surgery in the past, had right shoulder arthroscopic surgery and left knee surgery in the past,  status post colectomy in the past.  SOCIAL HISTORY:  No history of smoking.  Drinks occasionally socially.  ALLERGIES:  She is allergic to ASPIRIN, MOTRIN, IV DYE.  MEDICATIONS AT HOME:  She is on Avelox Plavix, Toprol, Protonix, Tribenzor.  PHYSICAL EXAMINATION:  GENERAL:  She was alert, awake, oriented.  Blood pressure was 130/66, pulse was 55, respiration 18. EYES:  Conjunctivae were pink. NECK:  Supple, no JVD. LUNGS:  She has bilateral wheezing. CARDIOVASCULAR:  S1, S2 was normal. ABDOMEN:  Soft, distended. EXTREMITIES:  There is no clubbing, cyanosis, or edema.  LABORATORY DATA:  Hemoglobin was 10.7, hematocrit 34.1, white count of 10.0.  Sodium was 139, potassium 4.0, glucose 96, BUN 16, creatinine 1.08.  Repeat hemoglobin was 9.6, hematocrit 31.3, white count of 15.4. The patient was started on steroids.  Repeat hemoglobin on 17th is 9.1, hematocrit 29.2, white count of 13.7.  Sodium is 143, potassium 3.5, BUN 18, creatinine 0.92.  Her iron was 51, iron binding capacity was 267, percent saturation was 19%.  Vitamin B12 level was 923, folate level was 8.3.  Chest x-ray showed decreasing right base atelectasis or infiltrate with some persistent probable atelectasis of the left  base also.  Repeat chest x-ray done on 17th showed small pleural effusion, questionable mild pulmonary vascular congestion.  Linear scarring was noted/atelectasis was noted medially at the left lung base.  BRIEF HOSPITAL COURSE:  The patient was admitted to telemetry unit.  The patient was started on beta-agonist and was started also on p.o. prednisone and p.o. Avelox was switched to IV Avelox with improvement in her symptoms.  The patient's prednisone dose has been reduced gradually. The patient remained afebrile during the hospital stay.  The patient has been ambulating in hallway without any problems.  Her wheezing has completely resolved.  The patient will be discharged home on above medications  and will be followed up in my office in 1 week.     Eduardo Osier. Sharyn Lull, M.D.     [facility]  D:  06/28/2010  T:  06/29/2010  Job:  [id number]  Electronically Signed by Rinaldo Cloud M.D. on 06/30/2010 08:57:19 PM

## 2010-08-02 ENCOUNTER — Other Ambulatory Visit: Payer: Self-pay | Admitting: Cardiology

## 2010-08-02 DIAGNOSIS — Z1231 Encounter for screening mammogram for malignant neoplasm of breast: Secondary | ICD-10-CM

## 2010-08-29 ENCOUNTER — Ambulatory Visit
Admission: RE | Admit: 2010-08-29 | Discharge: 2010-08-29 | Disposition: A | Discharge status: Home / Self Care | Payer: Medicare Other | Source: Ambulatory Visit | Attending: Cardiology | Admitting: Cardiology

## 2010-08-29 DIAGNOSIS — Z1231 Encounter for screening mammogram for malignant neoplasm of breast: Secondary | ICD-10-CM

## 2010-10-30 LAB — BASIC METABOLIC PANEL
BUN: 12
BUN: 17
BUN: 8
CO2: 27
CO2: 29
Calcium: 8.4
Calcium: 8.5
Calcium: 8.6
Calcium: 9.3
Chloride: 98
Creatinine, Ser: 0.97
Creatinine, Ser: 0.98
Creatinine, Ser: 1.07
Creatinine, Ser: 1.26 — ABNORMAL HIGH
GFR calc Af Amer: >60
GFR calc Af Amer: >60
GFR calc Af Amer: >60
GFR calc non Af Amer: 43 — ABNORMAL LOW
GFR calc non Af Amer: 51 — ABNORMAL LOW
Glucose, Bld: 143 — ABNORMAL HIGH
Potassium: 4.3
Sodium: 137

## 2010-10-30 LAB — CBC
HCT: 30.2 — ABNORMAL LOW
Hemoglobin: 7.6 — CL
MCHC: 32.4
MCHC: 32.6
MCHC: 33.1
MCV: 83.6
Platelets: 181
Platelets: 208
Platelets: 260
RBC: 2.76 — ABNORMAL LOW
RBC: 4.49
RDW: 15.6 — ABNORMAL HIGH
WBC: 13.7 — ABNORMAL HIGH
WBC: 14.2 — ABNORMAL HIGH

## 2010-10-30 LAB — PROTIME-INR
INR: 0.9
INR: 1
INR: 1.2
Prothrombin Time: 12.4
Prothrombin Time: 13.2
Prothrombin Time: 15.9 — ABNORMAL HIGH
Prothrombin Time: 16.9 — ABNORMAL HIGH

## 2010-10-30 LAB — CROSSMATCH: Antibody Screen: NEGATIVE

## 2010-10-30 LAB — APTT: aPTT: 38 — ABNORMAL HIGH

## 2010-10-30 LAB — ABO/RH: ABO/RH(D): O POS

## 2010-10-31 LAB — PROTIME-INR
INR: 1.8 — ABNORMAL HIGH
Prothrombin Time: 21.6 — ABNORMAL HIGH

## 2010-10-31 LAB — CBC
HCT: 28.9 — ABNORMAL LOW
Hemoglobin: 9.6 — ABNORMAL LOW
MCHC: 33.1
MCV: 84.8
Platelets: 267
RBC: 3.41 — ABNORMAL LOW
RDW: 15.7 — ABNORMAL HIGH
WBC: 13 — ABNORMAL HIGH

## 2010-11-01 ENCOUNTER — Other Ambulatory Visit (HOSPITAL_COMMUNITY): Payer: Self-pay | Admitting: Cardiology

## 2010-11-07 LAB — POCT CARDIAC MARKERS
CKMB, poc: <1 — ABNORMAL LOW
Troponin i, poc: <0.05

## 2010-11-07 LAB — POCT I-STAT, CHEM 8
Chloride: 105
Glucose, Bld: 103 — ABNORMAL HIGH
HCT: 36
Potassium: 3.7

## 2010-11-07 LAB — D-DIMER, QUANTITATIVE: D-Dimer, Quant: 1.3 — ABNORMAL HIGH

## 2010-11-08 ENCOUNTER — Ambulatory Visit (HOSPITAL_COMMUNITY)
Admission: RE | Admit: 2010-11-08 | Discharge: 2010-11-08 | Disposition: A | Discharge status: Home / Self Care | Payer: Medicare Other | Source: Ambulatory Visit | Attending: Cardiology | Admitting: Cardiology

## 2010-11-08 ENCOUNTER — Encounter (HOSPITAL_COMMUNITY)
Admission: RE | Admit: 2010-11-08 | Discharge: 2010-11-08 | Disposition: A | Discharge status: Home / Self Care | Payer: Medicare Other | Source: Ambulatory Visit | Attending: Cardiology | Admitting: Cardiology

## 2010-11-08 DIAGNOSIS — R079 Chest pain, unspecified: Secondary | ICD-10-CM | POA: Insufficient documentation

## 2010-11-08 MED ORDER — TECHNETIUM TC 99M TETROFOSMIN IV KIT
30.0000 | PACK | Freq: Once | INTRAVENOUS | Status: AC | PRN
  Administered 2010-11-08: 30 via INTRAVENOUS

## 2010-11-08 MED ORDER — TECHNETIUM TC 99M TETROFOSMIN IV KIT
10.0000 | PACK | Freq: Once | INTRAVENOUS | Status: AC | PRN
  Administered 2010-11-08: 10 via INTRAVENOUS

## 2010-11-15 LAB — DIFFERENTIAL
Basophils Absolute: 0.1
Eosinophils Absolute: 0.1
Lymphocytes Relative: 36
Neutro Abs: 3.7
Neutrophils Relative %: 56

## 2010-11-15 LAB — URINE MICROSCOPIC-ADD ON

## 2010-11-15 LAB — COMPREHENSIVE METABOLIC PANEL
Alkaline Phosphatase: 98
BUN: 12
CO2: 29
Chloride: 103
Glucose, Bld: 102 — ABNORMAL HIGH
Potassium: 4.1
Total Bilirubin: 0.9

## 2010-11-15 LAB — URINE CULTURE

## 2010-11-15 LAB — CBC
HCT: 35.4 — ABNORMAL LOW
Hemoglobin: 11.7 — ABNORMAL LOW
WBC: 6.7

## 2010-11-15 LAB — URINALYSIS, ROUTINE W REFLEX MICROSCOPIC
Nitrite: NEGATIVE
Specific Gravity, Urine: 1.029
Urobilinogen, UA: 0.2

## 2010-11-15 LAB — LIPASE, BLOOD: Lipase: 25

## 2011-08-06 ENCOUNTER — Other Ambulatory Visit: Payer: Self-pay | Admitting: Cardiology

## 2011-08-06 DIAGNOSIS — Z1231 Encounter for screening mammogram for malignant neoplasm of breast: Secondary | ICD-10-CM

## 2011-09-05 ENCOUNTER — Ambulatory Visit
Admission: RE | Admit: 2011-09-05 | Discharge: 2011-09-05 | Disposition: A | Discharge status: Home / Self Care | Payer: Medicare Other | Source: Ambulatory Visit | Attending: Cardiology | Admitting: Cardiology

## 2011-09-05 DIAGNOSIS — Z1231 Encounter for screening mammogram for malignant neoplasm of breast: Secondary | ICD-10-CM

## 2012-08-03 ENCOUNTER — Other Ambulatory Visit: Payer: Self-pay

## 2012-08-03 DIAGNOSIS — Z1231 Encounter for screening mammogram for malignant neoplasm of breast: Secondary | ICD-10-CM

## 2012-09-01 ENCOUNTER — Emergency Department (HOSPITAL_COMMUNITY)
Admission: EM | Admit: 2012-09-01 | Discharge: 2012-09-01 | Disposition: A | Discharge status: Home / Self Care | Payer: Medicare Other | Attending: Emergency Medicine | Admitting: Emergency Medicine

## 2012-09-01 ENCOUNTER — Emergency Department (HOSPITAL_COMMUNITY): Payer: Medicare Other

## 2012-09-01 ENCOUNTER — Encounter (HOSPITAL_COMMUNITY): Payer: Self-pay | Admitting: Emergency Medicine

## 2012-09-01 DIAGNOSIS — Z8739 Personal history of other diseases of the musculoskeletal system and connective tissue: Secondary | ICD-10-CM | POA: Insufficient documentation

## 2012-09-01 DIAGNOSIS — Z90710 Acquired absence of both cervix and uterus: Secondary | ICD-10-CM | POA: Insufficient documentation

## 2012-09-01 DIAGNOSIS — R109 Unspecified abdominal pain: Secondary | ICD-10-CM

## 2012-09-01 DIAGNOSIS — R350 Frequency of micturition: Secondary | ICD-10-CM | POA: Insufficient documentation

## 2012-09-01 DIAGNOSIS — N390 Urinary tract infection, site not specified: Secondary | ICD-10-CM | POA: Insufficient documentation

## 2012-09-01 DIAGNOSIS — Z791 Long term (current) use of non-steroidal anti-inflammatories (NSAID): Secondary | ICD-10-CM | POA: Insufficient documentation

## 2012-09-01 DIAGNOSIS — E785 Hyperlipidemia, unspecified: Secondary | ICD-10-CM | POA: Insufficient documentation

## 2012-09-01 DIAGNOSIS — Z79899 Other long term (current) drug therapy: Secondary | ICD-10-CM | POA: Insufficient documentation

## 2012-09-01 DIAGNOSIS — Z9889 Other specified postprocedural states: Secondary | ICD-10-CM | POA: Insufficient documentation

## 2012-09-01 DIAGNOSIS — I10 Essential (primary) hypertension: Secondary | ICD-10-CM | POA: Insufficient documentation

## 2012-09-01 DIAGNOSIS — M549 Dorsalgia, unspecified: Secondary | ICD-10-CM | POA: Insufficient documentation

## 2012-09-01 HISTORY — DX: Essential (primary) hypertension: I10

## 2012-09-01 HISTORY — DX: Fibromyalgia: M79.7

## 2012-09-01 HISTORY — DX: Hyperlipidemia, unspecified: E78.5

## 2012-09-01 LAB — URINALYSIS, ROUTINE W REFLEX MICROSCOPIC
Bilirubin Urine: NEGATIVE
Glucose, UA: NEGATIVE mg/dL
Ketones, ur: NEGATIVE mg/dL
Protein, ur: NEGATIVE mg/dL

## 2012-09-01 LAB — URINE MICROSCOPIC-ADD ON

## 2012-09-01 LAB — BASIC METABOLIC PANEL
BUN: 21 mg/dL (ref 6–23)
CO2: 26 mEq/L (ref 19–32)
Calcium: 9.6 mg/dL (ref 8.4–10.5)
Creatinine, Ser: 0.96 mg/dL (ref 0.50–1.10)

## 2012-09-01 LAB — HEPATIC FUNCTION PANEL
ALT: 13 U/L (ref 0–35)
Alkaline Phosphatase: 99 U/L (ref 39–117)
Bilirubin, Direct: <0.1 mg/dL (ref 0.0–0.3)
Total Bilirubin: 0.3 mg/dL (ref 0.3–1.2)
Total Protein: 7.7 g/dL (ref 6.0–8.3)

## 2012-09-01 LAB — LIPASE, BLOOD: Lipase: 30 U/L (ref 11–59)

## 2012-09-01 MED ORDER — MORPHINE SULFATE 4 MG/ML IJ SOLN: 4.0000 mg | Freq: Once | INTRAMUSCULAR | Status: DC

## 2012-09-01 MED ORDER — SODIUM CHLORIDE 0.9 % IV BOLUS (SEPSIS)
500.0000 mL | Freq: Once | INTRAVENOUS | Status: AC
  Administered 2012-09-01: 500 mL via INTRAVENOUS

## 2012-09-01 MED ORDER — TRAMADOL HCL 50 MG PO TABS: 50.0000 mg | ORAL_TABLET | Freq: Four times a day (QID) | ORAL | Status: DC | PRN

## 2012-09-01 MED ORDER — CEPHALEXIN 500 MG PO CAPS
500.0000 mg | ORAL_CAPSULE | Freq: Once | ORAL | Status: AC
  Administered 2012-09-01: 500 mg via ORAL
  Filled 2012-09-01: qty 1

## 2012-09-01 MED ORDER — CEPHALEXIN 500 MG PO CAPS: 500.0000 mg | ORAL_CAPSULE | Freq: Four times a day (QID) | ORAL | Status: DC

## 2012-09-01 MED ORDER — FENTANYL CITRATE 0.05 MG/ML IJ SOLN
50.0000 ug | Freq: Once | INTRAMUSCULAR | Status: AC
  Administered 2012-09-01: 50 ug via INTRAVENOUS
  Filled 2012-09-01: qty 2

## 2012-09-01 NOTE — ED Notes (Addendum)
IV unsuccessful at IV start x2. PA notified.

## 2012-09-01 NOTE — ED Provider Notes (Signed)
History    CSN: [id number] Arrival date & time 09/01/12  1011  First MD Initiated Contact with Patient 09/01/12 1026     Chief Complaint  Patient presents with  . Back Pain  . Flank Pain   (Consider location/radiation/quality/duration/timing/severity/associated sxs/prior Treatment) Patient is a 72 y.o. female presenting with flank pain. The history is provided by the patient. No language interpreter was used.  Flank Pain This is a new problem. The current episode started yesterday. Pertinent negatives include no abdominal pain, chest pain, fever, myalgias, nausea, vomiting or weakness. Associated symptoms comments: Right flank pain since yesterday. It hurts worse when she moves but continues at rest as well. No fever, nausea, vomiting, change in bowel movement, dysuria or hematuria. She denies history of kidney stones. .   Past Medical History  Diagnosis Date  . Hypertension   . Fibromyalgia   . Hyperlipidemia    Past Surgical History  Procedure Laterality Date  . Thyroidectomy    . Abdominal hysterectomy    . Back surgery    . Colon surgery    . Carpal tunnel release     History reviewed. No pertinent family history. History  Substance Use Topics  . Smoking status: Never Smoker   . Smokeless tobacco: Not on file  . Alcohol Use: No   OB History   Grav Para Term Preterm Abortions TAB SAB Ect Mult Living                 Review of Systems  Constitutional: Negative for fever.  Respiratory: Negative for shortness of breath.   Cardiovascular: Negative for chest pain.  Gastrointestinal: Negative for nausea, vomiting, abdominal pain, diarrhea and constipation.  Genitourinary: Positive for frequency and flank pain. Negative for dysuria and hematuria.  Musculoskeletal: Positive for back pain. Negative for myalgias.  Neurological: Negative for weakness.  Psychiatric/Behavioral: Negative for confusion.    Allergies  Aspirin and Motrin  Home Medications   Current  Outpatient Rx  Name  Route  Sig  Dispense  Refill  . Ascorbic Acid (VITAMIN C) 1000 MG tablet   Oral   Take 1,000 mg by mouth daily.         . clopidogrel (PLAVIX) 75 MG tablet   Oral   Take 75 mg by mouth daily.         . meloxicam (MOBIC) 15 MG tablet   Oral   Take 15 mg by mouth daily as needed for pain.         . metoprolol succinate (TOPROL-XL) 50 MG 24 hr tablet   Oral   Take 50 mg by mouth daily. Take with or immediately following a meal.         . Olmesartan-Amlodipine-HCTZ (TRIBENZOR) 40-10-25 MG TABS   Oral   Take 1 tablet by mouth daily.         . pantoprazole (PROTONIX) 40 MG tablet   Oral   Take 40 mg by mouth daily.         . rosuvastatin (CRESTOR) 20 MG tablet   Oral   Take 20 mg by mouth daily.          BP 154/78  Pulse 57  Temp(Src) 97.6 F (36.4 C) (Oral)  Resp 17  SpO2 100% Physical Exam  Constitutional: She is oriented to person, place, and time. She appears well-developed and well-nourished.  HENT:  Head: Normocephalic.  Neck: Normal range of motion. Neck supple.  Cardiovascular: Normal rate and regular rhythm.   Pulmonary/Chest:  Effort normal and breath sounds normal.  Abdominal: Soft. Bowel sounds are normal. There is no tenderness. There is no rebound and no guarding.  No RUQ tenderness.   Genitourinary:  No CVA tenderness.   Musculoskeletal: Normal range of motion.  Neurological: She is alert and oriented to person, place, and time.  Skin: Skin is warm and dry. No rash noted.  Psychiatric: She has a normal mood and affect.    ED Course  Procedures (including critical care time) Labs Reviewed  URINALYSIS, ROUTINE W REFLEX MICROSCOPIC - Abnormal; Notable for the following:    APPearance CLOUDY (*)    Hgb urine dipstick TRACE (*)    Leukocytes, UA MODERATE (*)    All other components within normal limits  BASIC METABOLIC PANEL - Abnormal; Notable for the following:    GFR calc non Af Amer 59 (*)    GFR calc Af Amer 68  (*)    All other components within normal limits  URINE MICROSCOPIC-ADD ON - Abnormal; Notable for the following:    Bacteria, UA FEW (*)    All other components within normal limits  URINE CULTURE  HEPATIC FUNCTION PANEL  LIPASE, BLOOD  CBC WITH DIFFERENTIAL  CBC WITH DIFFERENTIAL   Results for orders placed during the hospital encounter of 09/01/12  URINALYSIS, ROUTINE W REFLEX MICROSCOPIC      Result Value Range   Color, Urine YELLOW  YELLOW   APPearance CLOUDY (*) CLEAR   Specific Gravity, Urine 1.023  1.005 - 1.030   pH 5.0  5.0 - 8.0   Glucose, UA NEGATIVE  NEGATIVE mg/dL   Hgb urine dipstick TRACE (*) NEGATIVE   Bilirubin Urine NEGATIVE  NEGATIVE   Ketones, ur NEGATIVE  NEGATIVE mg/dL   Protein, ur NEGATIVE  NEGATIVE mg/dL   Urobilinogen, UA 0.2  0.0 - 1.0 mg/dL   Nitrite NEGATIVE  NEGATIVE   Leukocytes, UA MODERATE (*) NEGATIVE  BASIC METABOLIC PANEL      Result Value Range   Sodium 137  135 - 145 mEq/L   Potassium 4.6  3.5 - 5.1 mEq/L   Chloride 102  96 - 112 mEq/L   CO2 26  19 - 32 mEq/L   Glucose, Bld 96  70 - 99 mg/dL   BUN 21  6 - 23 mg/dL   Creatinine, Ser 1.47  0.50 - 1.10 mg/dL   Calcium 9.6  8.4 - 82.9 mg/dL   GFR calc non Af Amer 59 (*) >90 mL/min   GFR calc Af Amer 68 (*) >90 mL/min  URINE MICROSCOPIC-ADD ON      Result Value Range   Squamous Epithelial / LPF RARE  RARE   WBC, UA 7-10  <3 WBC/hpf   RBC / HPF 0-2  <3 RBC/hpf   Bacteria, UA FEW (*) RARE   Urine-Other MUCOUS PRESENT    HEPATIC FUNCTION PANEL      Result Value Range   Total Protein 7.7  6.0 - 8.3 g/dL   Albumin 3.7  3.5 - 5.2 g/dL   AST 24  0 - 37 U/L   ALT 13  0 - 35 U/L   Alkaline Phosphatase 99  39 - 117 U/L   Total Bilirubin 0.3  0.3 - 1.2 mg/dL   Bilirubin, Direct <5.6  0.0 - 0.3 mg/dL   Indirect Bilirubin NOT CALCULATED  0.3 - 0.9 mg/dL  LIPASE, BLOOD      Result Value Range   Lipase 30  11 - 59 U/L  Ct Abdomen Pelvis Wo Contrast  09/01/2012   *RADIOLOGY REPORT*   Clinical Data: Right flank pain for 1 day.  History of colon resection, diverticulitis and total hysterectomy.  CT ABDOMEN AND PELVIS WITHOUT CONTRAST  Technique:  Multidetector CT imaging of the abdomen and pelvis was performed following the standard protocol without intravenous contrast.  Comparison: Abdominal pelvic CT 02/06/2007.  Findings: The lung bases are clear.  There is mild left lower lobe bronchiectasis.  There is no pleural or pericardial effusion.  Both kidneys appear normal as imaged in the noncontrast state. There is no evidence of renal or ureteral calculus.  There is no hydronephrosis or perinephric soft tissue stranding.  Multiple pelvic phleboliths are present bilaterally.  The bladder appears unremarkable.  The liver, spleen, gallbladder, pancreas and adrenal glands appear normal.  There are postsurgical changes within the anterior abdominal wall status post apparent sigmoid colon resection and hysterectomy. There is no focal hernia.  No inflammatory changes are identified. The appendix appears normal.  There is no adnexal mass.  There are degenerative changes throughout the spine status post fusion at L5-S1.  There is prominent vacuum phenomenon within the L4-L5 disc and facet joints bilaterally.  IMPRESSION:  1.  No evidence of urinary tract calculus or hydronephrosis. 2.  No acute abdominal pelvic findings or explanation for right flank pain identified. 3.  Postsurgical changes within the anterior abdominal wall and diffuse lumbar spondylosis.   Original Report Authenticated By: Carey Bullocks, M.D.   No results found. No diagnosis found. 1. Flank pain 2. UTI  MDM  She has urinary frequency without dysuria, fever, N, V. Urine has mild leukocytes - will culture. CT negative, blood studies are essentially normal without evidence of other acute process. Pain is better with Fentanyl. Dr. Effie Shy has been to see the patient and is involved in care. Plan: treat UTI pending cultures and have  PCP follow up this week.   Arnoldo Hooker, PA-C 09/01/12 1546

## 2012-09-01 NOTE — ED Notes (Signed)
IV team at bedside 

## 2012-09-01 NOTE — ED Notes (Signed)
Unable to get IV start x3 IV paged.

## 2012-09-01 NOTE — ED Provider Notes (Signed)
Stephanie Mcdaniel is a 72 y.o. female Here for evaluation of right lower flank pain. The pain started 2 days ago. There's been no trauma. She is taking meloxicam, intermittently for right shoulder and right wrist pain. She states that she has had arthritis in the past. She denies fever, chills, nausea, vomiting. She's had mild urinary frequency, and dysuria.   Alert, elderly, female, in mild discomfort. She is holding her right flank d/t pain. There is no evident. Right flank rash. She has decreased range of motion of the back and right leg, secondary to right flank pain.  Assessment- nonspecific low back pain with differential diagnosis including degenerative joint disease, clinically occult shingles, nerve impingement, and sciatica. She is stable for discharge with symptomatic care.  Medical screening examination/treatment/procedure(s) were conducted as a shared visit with non-physician practitioner(s) and myself.  I personally evaluated the patient during the encounter  Flint Melter, MD 09/01/12 [phone]

## 2012-09-01 NOTE — ED Notes (Signed)
Pt c/o low back pain on the right side since yesterday.  States that after she slept on it, it got worse.  Denies injury.  C/o frequency of urination.

## 2012-09-01 NOTE — Progress Notes (Signed)
Patient reports her pcp is Dr. Sharyn Lull.

## 2012-09-02 LAB — URINE CULTURE: Culture: NO GROWTH

## 2012-09-06 ENCOUNTER — Ambulatory Visit
Admission: RE | Admit: 2012-09-06 | Discharge: 2012-09-06 | Disposition: A | Discharge status: Home / Self Care | Payer: Self-pay | Source: Ambulatory Visit

## 2012-09-06 DIAGNOSIS — Z1231 Encounter for screening mammogram for malignant neoplasm of breast: Secondary | ICD-10-CM

## 2013-08-02 ENCOUNTER — Other Ambulatory Visit: Payer: Self-pay

## 2013-08-02 DIAGNOSIS — Z1231 Encounter for screening mammogram for malignant neoplasm of breast: Secondary | ICD-10-CM

## 2013-09-07 ENCOUNTER — Ambulatory Visit
Admission: RE | Admit: 2013-09-07 | Discharge: 2013-09-07 | Disposition: A | Discharge status: Home / Self Care | Payer: Medicare Other | Source: Ambulatory Visit

## 2013-09-07 DIAGNOSIS — Z1231 Encounter for screening mammogram for malignant neoplasm of breast: Secondary | ICD-10-CM

## 2014-03-30 ENCOUNTER — Encounter (HOSPITAL_COMMUNITY): Payer: Self-pay

## 2014-03-30 ENCOUNTER — Emergency Department (HOSPITAL_COMMUNITY)
Admission: EM | Admit: 2014-03-30 | Discharge: 2014-03-30 | Disposition: A | Discharge status: Home / Self Care | Payer: Medicare Other | Attending: Emergency Medicine | Admitting: Emergency Medicine

## 2014-03-30 ENCOUNTER — Emergency Department (HOSPITAL_COMMUNITY): Payer: Medicare Other

## 2014-03-30 DIAGNOSIS — I10 Essential (primary) hypertension: Secondary | ICD-10-CM | POA: Diagnosis not present

## 2014-03-30 DIAGNOSIS — Z8739 Personal history of other diseases of the musculoskeletal system and connective tissue: Secondary | ICD-10-CM | POA: Diagnosis not present

## 2014-03-30 DIAGNOSIS — Y9389 Activity, other specified: Secondary | ICD-10-CM | POA: Diagnosis not present

## 2014-03-30 DIAGNOSIS — Y998 Other external cause status: Secondary | ICD-10-CM | POA: Insufficient documentation

## 2014-03-30 DIAGNOSIS — S0121XA Laceration without foreign body of nose, initial encounter: Secondary | ICD-10-CM | POA: Diagnosis not present

## 2014-03-30 DIAGNOSIS — E785 Hyperlipidemia, unspecified: Secondary | ICD-10-CM | POA: Insufficient documentation

## 2014-03-30 DIAGNOSIS — Z7902 Long term (current) use of antithrombotics/antiplatelets: Secondary | ICD-10-CM | POA: Insufficient documentation

## 2014-03-30 DIAGNOSIS — Z79899 Other long term (current) drug therapy: Secondary | ICD-10-CM | POA: Insufficient documentation

## 2014-03-30 DIAGNOSIS — S0992XA Unspecified injury of nose, initial encounter: Secondary | ICD-10-CM | POA: Diagnosis present

## 2014-03-30 DIAGNOSIS — S01511A Laceration without foreign body of lip, initial encounter: Secondary | ICD-10-CM | POA: Diagnosis not present

## 2014-03-30 DIAGNOSIS — Y92410 Unspecified street and highway as the place of occurrence of the external cause: Secondary | ICD-10-CM | POA: Insufficient documentation

## 2014-03-30 DIAGNOSIS — S0181XA Laceration without foreign body of other part of head, initial encounter: Secondary | ICD-10-CM | POA: Diagnosis not present

## 2014-03-30 MED ORDER — CEPHALEXIN 500 MG PO CAPS: 500.0000 mg | ORAL_CAPSULE | Freq: Four times a day (QID) | ORAL | Status: DC

## 2014-03-30 MED ORDER — MORPHINE SULFATE 4 MG/ML IJ SOLN
4.0000 mg | Freq: Once | INTRAMUSCULAR | Status: AC
  Administered 2014-03-30: 4 mg via INTRAVENOUS
  Filled 2014-03-30: qty 1

## 2014-03-30 MED ORDER — BUPIVACAINE HCL (PF) 0.5 % IJ SOLN
10.0000 mL | Freq: Once | INTRAMUSCULAR | Status: AC
  Administered 2014-03-30: 10 mL
  Filled 2014-03-30: qty 10

## 2014-03-30 MED ORDER — BUPIVACAINE HCL 0.5 % IJ SOLN
10.0000 mL | Freq: Once | INTRAMUSCULAR | Status: DC
  Filled 2014-03-30 (×2): qty 10

## 2014-03-30 MED ORDER — TETANUS-DIPHTH-ACELL PERTUSSIS 5-2.5-18.5 LF-MCG/0.5 IM SUSP
0.5000 mL | Freq: Once | INTRAMUSCULAR | Status: AC
  Administered 2014-03-30: 0.5 mL via INTRAMUSCULAR
  Filled 2014-03-30: qty 0.5

## 2014-03-30 NOTE — ED Notes (Signed)
MD at bedside. Dr Wilson Singer

## 2014-03-30 NOTE — ED Notes (Signed)
Patient transported to CT 
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
## 2014-03-30 NOTE — Discharge Instructions (Signed)
Absorbable Suture Repair Absorbable sutures (stitches) hold skin together so you can heal. Keep skin wounds clean and dry for the next 2 to 3 days. Then, you may gently wash your wound and dress it with an antibiotic ointment as recommended. As your wound begins to heal, the sutures are no longer needed, and they typically begin to fall off. This will take 7 to 10 days. After 10 days, if your sutures are loose, you can remove them by wiping with a clean gauze pad or a cotton ball. Do not pull your sutures out. They should wipe away easily. If after 10 days they do not easily wipe away, have your caregiver take them out. Absorbable sutures may be used deep in a wound to help hold it together. If these stitches are below the skin, the body will absorb them completely in 3 to 4 weeks.  You may need a tetanus shot if:  You cannot remember when you had your last tetanus shot.  You have never had a tetanus shot. If you get a tetanus shot, your arm may swell, get red, and feel warm to the touch. This is common and not a problem. If you need a tetanus shot and you choose not to have one, there is a rare chance of getting tetanus. Sickness from tetanus can be serious. SEEK IMMEDIATE MEDICAL CARE IF:  You have redness in the wound area.  The wound area feels hot to the touch.  You develop swelling in the wound area.  You develop pain.  There is fluid drainage from the wound. Document Released: 03/06/2004 Document Revised: 04/21/2011 Document Reviewed: 06/18/2010 [facility] Patient Information 2015 [place], [place]. This information is not intended to replace advice given to you by your health care provider. Make sure you discuss any questions you have with your health care provider.  Facial Laceration  A facial laceration is a cut on the face. These injuries can be painful and cause bleeding. Lacerations usually heal quickly, but they need special care to reduce scarring. DIAGNOSIS  Your health care  provider will take a medical history, ask for details about how the injury occurred, and examine the wound to determine how deep the cut is. TREATMENT  Some facial lacerations may not require closure. Others may not be able to be closed because of an increased risk of infection. The risk of infection and the chance for successful closure will depend on various factors, including the amount of time since the injury occurred. The wound may be cleaned to help prevent infection. If closure is appropriate, pain medicines may be given if needed. Your health care provider will use stitches (sutures), wound glue (adhesive), or skin adhesive strips to repair the laceration. These tools bring the skin edges together to allow for faster healing and a better cosmetic outcome. If needed, you may also be given a tetanus shot. HOME CARE INSTRUCTIONS  Only take over-the-counter or prescription medicines as directed by your health care provider.  Follow your health care provider's instructions for wound care. These instructions will vary depending on the technique used for closing the wound. For Sutures:  Keep the wound clean and dry.   If you were given a bandage (dressing), you should change it at least once a day. Also change the dressing if it becomes wet or dirty, or as directed by your health care provider.   Wash the wound with soap and water 2 times a day. Rinse the wound off with water to remove all  soap. Pat the wound dry with a clean towel.   After cleaning, apply a thin layer of the antibiotic ointment recommended by your health care provider. This will help prevent infection and keep the dressing from sticking.   You may shower as usual after the first 24 hours. Do not soak the wound in water until the sutures are removed.   Get your sutures removed as directed by your health care provider. With facial lacerations, sutures should usually be taken out after 4-5 days to avoid stitch marks.    Wait a few days after your sutures are removed before applying any makeup. For Skin Adhesive Strips:  Keep the wound clean and dry.   Do not get the skin adhesive strips wet. You may bathe carefully, using caution to keep the wound dry.   If the wound gets wet, pat it dry with a clean towel.   Skin adhesive strips will fall off on their own. You may trim the strips as the wound heals. Do not remove skin adhesive strips that are still stuck to the wound. They will fall off in time.  For Wound Adhesive:  You may briefly wet your wound in the shower or bath. Do not soak or scrub the wound. Do not swim. Avoid periods of heavy sweating until the skin adhesive has fallen off on its own. After showering or bathing, gently pat the wound dry with a clean towel.   Do not apply liquid medicine, cream medicine, ointment medicine, or makeup to your wound while the skin adhesive is in place. This may loosen the film before your wound is healed.   If a dressing is placed over the wound, be careful not to apply tape directly over the skin adhesive. This may cause the adhesive to be pulled off before the wound is healed.   Avoid prolonged exposure to sunlight or tanning lamps while the skin adhesive is in place.  The skin adhesive will usually remain in place for 5-10 days, then naturally fall off the skin. Do not pick at the adhesive film.  After Healing: Once the wound has healed, cover the wound with sunscreen during the day for 1 full year. This can help minimize scarring. Exposure to ultraviolet light in the first year will darken the scar. It can take 1-2 years for the scar to lose its redness and to heal completely.  SEEK IMMEDIATE MEDICAL CARE IF:  You have redness, pain, or swelling around the wound.   You see ayellowish-white fluid (pus) coming from the wound.   You have chills or a fever.  MAKE SURE YOU:  Understand these instructions.  Will watch your condition.  Will  get help right away if you are not doing well or get worse. Document Released: 03/06/2004 Document Revised: 11/17/2012 Document Reviewed: 09/09/2012 [facility] Patient Information 2015 [place], [place]. This information is not intended to replace advice given to you by your health care provider. Make sure you discuss any questions you have with your health care provider.

## 2014-03-30 NOTE — ED Notes (Signed)
MD at the bedside  
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
## 2014-04-08 ENCOUNTER — Encounter (HOSPITAL_COMMUNITY): Payer: Self-pay | Admitting: Emergency Medicine

## 2014-04-08 ENCOUNTER — Emergency Department (HOSPITAL_COMMUNITY)
Admission: EM | Admit: 2014-04-08 | Discharge: 2014-04-08 | Disposition: A | Discharge status: Home / Self Care | Payer: Medicare Other | Attending: Emergency Medicine | Admitting: Emergency Medicine

## 2014-04-08 DIAGNOSIS — Z792 Long term (current) use of antibiotics: Secondary | ICD-10-CM | POA: Insufficient documentation

## 2014-04-08 DIAGNOSIS — I10 Essential (primary) hypertension: Secondary | ICD-10-CM | POA: Insufficient documentation

## 2014-04-08 DIAGNOSIS — Z79899 Other long term (current) drug therapy: Secondary | ICD-10-CM | POA: Insufficient documentation

## 2014-04-08 DIAGNOSIS — Z8739 Personal history of other diseases of the musculoskeletal system and connective tissue: Secondary | ICD-10-CM | POA: Insufficient documentation

## 2014-04-08 DIAGNOSIS — Z4802 Encounter for removal of sutures: Secondary | ICD-10-CM | POA: Diagnosis not present

## 2014-04-08 DIAGNOSIS — E785 Hyperlipidemia, unspecified: Secondary | ICD-10-CM | POA: Diagnosis not present

## 2014-04-08 DIAGNOSIS — Z7902 Long term (current) use of antithrombotics/antiplatelets: Secondary | ICD-10-CM | POA: Diagnosis not present

## 2014-04-08 NOTE — ED Notes (Signed)
Pt answers questions appropriately, respirations 16, even, unlabored, skin color pink, dry, ambulating without difficulty.

## 2014-04-08 NOTE — ED Notes (Signed)
Pt. Stated here for suture removal at the nose area. No redness or swelling
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
## 2014-04-08 NOTE — Discharge Instructions (Signed)

## 2014-04-08 NOTE — ED Notes (Signed)
Pt seen by PA and discharged before nurse assessed.

## 2014-08-10 ENCOUNTER — Other Ambulatory Visit: Payer: Self-pay

## 2014-08-10 DIAGNOSIS — Z1231 Encounter for screening mammogram for malignant neoplasm of breast: Secondary | ICD-10-CM

## 2014-09-13 ENCOUNTER — Ambulatory Visit
Admission: RE | Admit: 2014-09-13 | Discharge: 2014-09-13 | Disposition: A | Discharge status: Home / Self Care | Payer: Medicare Other | Source: Ambulatory Visit

## 2014-09-13 DIAGNOSIS — Z1231 Encounter for screening mammogram for malignant neoplasm of breast: Secondary | ICD-10-CM

## 2015-08-15 ENCOUNTER — Emergency Department (HOSPITAL_COMMUNITY)
Admission: EM | Admit: 2015-08-15 | Discharge: 2015-08-15 | Disposition: A | Discharge status: Home / Self Care | Payer: Medicare Other | Attending: Emergency Medicine | Admitting: Emergency Medicine

## 2015-08-15 ENCOUNTER — Encounter (HOSPITAL_COMMUNITY): Payer: Self-pay | Admitting: Emergency Medicine

## 2015-08-15 ENCOUNTER — Emergency Department (HOSPITAL_COMMUNITY): Payer: Medicare Other

## 2015-08-15 DIAGNOSIS — I10 Essential (primary) hypertension: Secondary | ICD-10-CM | POA: Insufficient documentation

## 2015-08-15 DIAGNOSIS — J209 Acute bronchitis, unspecified: Secondary | ICD-10-CM | POA: Diagnosis not present

## 2015-08-15 DIAGNOSIS — R0602 Shortness of breath: Secondary | ICD-10-CM | POA: Diagnosis present

## 2015-08-15 DIAGNOSIS — Z79899 Other long term (current) drug therapy: Secondary | ICD-10-CM | POA: Diagnosis not present

## 2015-08-15 LAB — CBC
HCT: 36.1 % (ref 36.0–46.0)
Hemoglobin: 11 g/dL — ABNORMAL LOW (ref 12.0–15.0)
MCH: 25.5 pg — ABNORMAL LOW (ref 26.0–34.0)
MCHC: 30.5 g/dL (ref 30.0–36.0)
MCV: 83.8 fL (ref 78.0–100.0)
PLATELETS: 209 10*3/uL (ref 150–400)
RBC: 4.31 MIL/uL (ref 3.87–5.11)
RDW: 15.6 % — AB (ref 11.5–15.5)
WBC: 9.2 10*3/uL (ref 4.0–10.5)

## 2015-08-15 LAB — BASIC METABOLIC PANEL
Anion gap: 9 (ref 5–15)
Anion gap: 6 (ref 5–15)
BUN: 46 mg/dL — AB (ref 6–20)
BUN: 39 mg/dL — AB (ref 6–20)
CALCIUM: 9 mg/dL (ref 8.9–10.3)
CHLORIDE: 109 mmol/L (ref 101–111)
CO2: 24 mmol/L (ref 22–32)
CO2: 23 mmol/L (ref 22–32)
CREATININE: 1.88 mg/dL — AB (ref 0.44–1.00)
CREATININE: 1.39 mg/dL — AB (ref 0.44–1.00)
Calcium: 8.6 mg/dL — ABNORMAL LOW (ref 8.9–10.3)
Chloride: 103 mmol/L (ref 101–111)
GFR calc Af Amer: 29 mL/min — ABNORMAL LOW (ref >60)
GFR calc non Af Amer: 37 mL/min — ABNORMAL LOW (ref >60)
GFR, EST AFRICAN AMERICAN: 42 mL/min — AB (ref >60)
GFR, EST NON AFRICAN AMERICAN: 25 mL/min — AB (ref >60)
GLUCOSE: 98 mg/dL (ref 65–99)
Glucose, Bld: 118 mg/dL — ABNORMAL HIGH (ref 65–99)
Potassium: 4 mmol/L (ref 3.5–5.1)
Potassium: 3.9 mmol/L (ref 3.5–5.1)
SODIUM: 136 mmol/L (ref 135–145)
Sodium: 138 mmol/L (ref 135–145)

## 2015-08-15 LAB — I-STAT TROPONIN, ED: TROPONIN I, POC: 0 ng/mL (ref 0.00–0.08)

## 2015-08-15 LAB — I-STAT CG4 LACTIC ACID, ED: Lactic Acid, Venous: 1.41 mmol/L (ref 0.5–1.9)

## 2015-08-15 MED ORDER — IPRATROPIUM-ALBUTEROL 0.5-2.5 (3) MG/3ML IN SOLN
RESPIRATORY_TRACT | Status: AC
  Filled 2015-08-15: qty 3

## 2015-08-15 MED ORDER — SODIUM CHLORIDE 0.9 % IV BOLUS (SEPSIS)
1000.0000 mL | Freq: Once | INTRAVENOUS | Status: AC
  Administered 2015-08-15: 1000 mL via INTRAVENOUS

## 2015-08-15 MED ORDER — ALBUTEROL SULFATE HFA 108 (90 BASE) MCG/ACT IN AERS
2.0000 | INHALATION_SPRAY | Freq: Once | RESPIRATORY_TRACT | Status: AC
  Administered 2015-08-15: 2 via RESPIRATORY_TRACT
  Filled 2015-08-15: qty 6.7

## 2015-08-15 MED ORDER — BENZONATATE 100 MG PO CAPS
200.0000 mg | ORAL_CAPSULE | Freq: Once | ORAL | Status: AC
Start: 2015-08-15 — End: 2015-08-15
  Administered 2015-08-15: 200 mg via ORAL
  Filled 2015-08-15: qty 2

## 2015-08-15 MED ORDER — PREDNISONE 10 MG PO TABS: 50.0000 mg | ORAL_TABLET | Freq: Every day | ORAL | Status: DC

## 2015-08-15 MED ORDER — AEROCHAMBER PLUS FLO-VU MEDIUM MISC
1.0000 | Freq: Once | Status: AC
Start: 2015-08-15 — End: 2015-08-15
  Administered 2015-08-15: 1
  Filled 2015-08-15: qty 1

## 2015-08-15 MED ORDER — PREDNISONE 20 MG PO TABS
60.0000 mg | ORAL_TABLET | Freq: Once | ORAL | Status: AC
Start: 2015-08-15 — End: 2015-08-15
  Administered 2015-08-15: 60 mg via ORAL
  Filled 2015-08-15: qty 3

## 2015-08-15 MED ORDER — IPRATROPIUM-ALBUTEROL 0.5-2.5 (3) MG/3ML IN SOLN
3.0000 mL | Freq: Once | RESPIRATORY_TRACT | Status: AC
Start: 2015-08-15 — End: 2015-08-15
  Administered 2015-08-15: 3 mL via RESPIRATORY_TRACT

## 2015-08-15 MED ORDER — BENZONATATE 100 MG PO CAPS: 100.0000 mg | ORAL_CAPSULE | Freq: Three times a day (TID) | ORAL | Status: DC | PRN

## 2015-08-15 NOTE — ED Notes (Signed)
Fluids restarted at left upper arm.

## 2015-08-15 NOTE — ED Notes (Addendum)
Swelling noted at left [facility] iv site. Will not draw back blood. Fluids stopped.

## 2015-08-15 NOTE — ED Notes (Signed)
Pt sts productive cough and SOB x3 days; pt sts on antibiotics but getting worse; pt sts yellow sputum

## 2015-08-15 NOTE — ED Notes (Signed)
IV site at right less swollen. Pt states pain decreased.

## 2015-08-15 NOTE — ED Notes (Signed)
Patient verbalized understanding of discharge instructions and denies any further needs or questions at this time. VS stable. Patient ambulatory with steady gait, escorted to ED entrance in wheelchair and called security for escort to patient's car.

## 2015-08-15 NOTE — ED Provider Notes (Signed)
CSN: [id number]     Arrival date & time 08/15/15  1405 History   First MD Initiated Contact with Patient 08/15/15 1505     Chief Complaint  Patient presents with  . Shortness of Breath     (Consider location/radiation/quality/duration/timing/severity/associated sxs/prior Treatment) Patient is a 75 y.o. female presenting with cough. The history is provided by the patient.  Cough Cough characteristics:  Productive Sputum characteristics:  Yellow Severity:  Moderate Onset quality:  Gradual Duration:  5 days Timing:  Constant Progression:  Worsening Chronicity:  New Smoker: no   Context: upper respiratory infection   Context: not sick contacts   Relieved by: breathing treatment here. Worsened by:  Activity Ineffective treatments: azithromycin. Associated symptoms: chest pain (sternal with cough) and shortness of breath (with minimal activity)     Past Medical History  Diagnosis Date  . Hypertension   . Fibromyalgia   . Hyperlipidemia    Past Surgical History  Procedure Laterality Date  . Thyroidectomy    . Abdominal hysterectomy    . Back surgery    . Colon surgery    . Carpal tunnel release     History reviewed. No pertinent family history. Social History  Substance Use Topics  . Smoking status: Never Smoker   . Smokeless tobacco: None  . Alcohol Use: No   OB History    No data available     Review of Systems  Respiratory: Positive for cough and shortness of breath (with minimal activity).   Cardiovascular: Positive for chest pain (sternal with cough).  All other systems reviewed and are negative.     Allergies  Aspirin; Contrast media; and Motrin  Home Medications   Prior to Admission medications   Medication Sig Start Date End Date Taking? Authorizing Provider  acetaminophen (TYLENOL) 500 MG tablet Take 500 mg by mouth every 6 (six) hours as needed for moderate pain.    Yes Historical Provider, MD  Ascorbic Acid (VITAMIN C) 1000 MG tablet Take 1,000  mg by mouth daily.   Yes Historical Provider, MD  azithromycin (ZITHROMAX) 250 MG tablet Take 250 mg by mouth daily. Started 08/13/15 for 5 days ending 08/17/15 08/13/15  Yes Historical Provider, MD  clopidogrel (PLAVIX) 75 MG tablet Take 75 mg by mouth daily.   Yes Historical Provider, MD  metoprolol succinate (TOPROL-XL) 25 MG 24 hr tablet Take 12.5 mg by mouth daily.  03/13/14  Yes Historical Provider, MD  Olmesartan-Amlodipine-HCTZ (TRIBENZOR) 40-10-25 MG TABS Take 1 tablet by mouth daily.   Yes Historical Provider, MD  pantoprazole (PROTONIX) 40 MG tablet Take 40 mg by mouth daily.   Yes Historical Provider, MD  rosuvastatin (CRESTOR) 20 MG tablet Take 20 mg by mouth every evening.    Yes Historical Provider, MD  cephALEXin (KEFLEX) 500 MG capsule Take 1 capsule (500 mg total) by mouth 4 (four) times daily. Patient not taking: Reported on 03/30/2014 09/01/12   Charlann Lange, PA-C  cephALEXin (KEFLEX) 500 MG capsule Take 1 capsule (500 mg total) by mouth 4 (four) times daily. 03/30/14   Virgel Manifold, MD  traMADol (ULTRAM) 50 MG tablet Take 1 tablet (50 mg total) by mouth every 6 (six) hours as needed for pain. Patient not taking: Reported on 03/30/2014 09/01/12   Charlann Lange, PA-C   BP 99/59 mmHg  Pulse 58  Temp(Src) 98.5 F (36.9 C) (Oral)  Resp 22  Ht 5\' 4"  (1.626 m)  Wt 242 lb (109.77 kg)  BMI 41.52 kg/m2  SpO2 98% Physical  Exam  Constitutional: She is oriented to person, place, and time. She appears well-developed and well-nourished. No distress.  HENT:  Head: Normocephalic.  Eyes: Conjunctivae are normal.  Neck: Neck supple. No tracheal deviation present.  Cardiovascular: Normal rate, regular rhythm and normal heart sounds.   Pulmonary/Chest: Effort normal. No respiratory distress. She has wheezes (faint inspiratory and expiratory). She has no rales.  Abdominal: Soft. She exhibits no distension. There is no tenderness.  Neurological: She is alert and oriented to person, place, and time.   Skin: Skin is warm and dry.  Psychiatric: She has a normal mood and affect.  Vitals reviewed.   ED Course  Procedures (including critical care time) Labs Review Labs Reviewed  BASIC METABOLIC PANEL - Abnormal; Notable for the following:    BUN 46 (*)    Creatinine, Ser 1.88 (*)    GFR calc non Af Amer 25 (*)    GFR calc Af Amer 29 (*)    All other components within normal limits  CBC - Abnormal; Notable for the following:    Hemoglobin 11.0 (*)    MCH 25.5 (*)    RDW 15.6 (*)    All other components within normal limits  I-STAT TROPOININ, ED  I-STAT CG4 LACTIC ACID, ED    Imaging Review Dg Chest 2 View  08/15/2015  CLINICAL DATA:  Cough, shortness of breath, chest pain since yesterday. Hypertension. EXAM: CHEST  2 VIEW COMPARISON:  06/27/2010 FINDINGS: Linear atelectasis or scarring in the left base. Right lung is clear. Heart is normal size. No effusions. No acute bony abnormality. IMPRESSION: Left basilar scarring or atelectasis. No acute findings. Electronically Signed   By: Rolm Baptise M.D.   On: 08/15/2015 14:41   I have personally reviewed and evaluated these images and lab results as part of my medical decision-making.   EKG Interpretation   Date/Time:  Wednesday August 15 2015 14:08:11 EDT Ventricular Rate:  58 PR Interval:  196 QRS Duration: 146 QT Interval:  450 QTC Calculation: 441 R Axis:   88 Text Interpretation:  Sinus bradycardia Right bundle branch block Abnormal  ECG No significant change since last tracing Confirmed by Lakaisha Danish MD, Quillian Quince  [id number]) on 08/15/2015 3:06:10 PM      MDM   Final diagnoses:  Acute bronchitis, unspecified organism    75 y.o. female presents with Productive Cough over the last 5 days with increasing shortness of breath despite antibiotic therapy on azithromycin. On arrival she has some mild expiratory respiratory wheezing with a wet sounding cough, no consolidation on x-ray, no fever, no leukocytosis or other typical signs of  developing pneumonia. She has been covered for atypicals already. I suspect this is more likely a viral or atypical infection resulting in acute bronchitis. I discussed the pros and cons of steroid therapy and inpatient elected to proceed. Provided albuterol inhaler for help with symptoms and Tessalon Perles to help with cough. Patient was not hypoxemic, had no increased work of breathing.  It also appears that patient has a secondary acute kidney injury. Discussed with PCP Dr Terrence Dupont who recommended IVF and recheck of renal function and will see Pt in clinic tomorrow for recheck. BMP with slight improvement of BUN and Cr. Likely prerenal in setting of acute illness. Discharged with f/u tomorrow in clinic.    Leo Grosser, MD 08/16/15 [phone]

## 2015-08-15 NOTE — ED Notes (Signed)
IV attempted withouit success.

## 2015-08-15 NOTE — Discharge Instructions (Signed)

## 2015-08-16 ENCOUNTER — Emergency Department (HOSPITAL_COMMUNITY)
Admission: EM | Admit: 2015-08-16 | Discharge: 2015-08-16 | Disposition: A | Discharge status: Home / Self Care | Payer: Medicare Other | Attending: Emergency Medicine | Admitting: Emergency Medicine

## 2015-08-16 ENCOUNTER — Encounter (HOSPITAL_COMMUNITY): Payer: Self-pay

## 2015-08-16 DIAGNOSIS — Z7902 Long term (current) use of antithrombotics/antiplatelets: Secondary | ICD-10-CM | POA: Diagnosis not present

## 2015-08-16 DIAGNOSIS — N390 Urinary tract infection, site not specified: Secondary | ICD-10-CM | POA: Diagnosis not present

## 2015-08-16 DIAGNOSIS — J40 Bronchitis, not specified as acute or chronic: Secondary | ICD-10-CM | POA: Diagnosis not present

## 2015-08-16 DIAGNOSIS — I10 Essential (primary) hypertension: Secondary | ICD-10-CM | POA: Insufficient documentation

## 2015-08-16 DIAGNOSIS — N179 Acute kidney failure, unspecified: Secondary | ICD-10-CM | POA: Diagnosis not present

## 2015-08-16 DIAGNOSIS — R944 Abnormal results of kidney function studies: Secondary | ICD-10-CM | POA: Diagnosis present

## 2015-08-16 LAB — CBC WITH DIFFERENTIAL/PLATELET
Basophils Absolute: 0 10*3/uL (ref 0.0–0.1)
Basophils Relative: 0 %
Eosinophils Absolute: 0 10*3/uL (ref 0.0–0.7)
Eosinophils Relative: 0 %
HEMATOCRIT: 33.6 % — AB (ref 36.0–46.0)
Hemoglobin: 10.4 g/dL — ABNORMAL LOW (ref 12.0–15.0)
LYMPHS ABS: 1.4 10*3/uL (ref 0.7–4.0)
LYMPHS PCT: 21 %
MCH: 25.8 pg — AB (ref 26.0–34.0)
MCHC: 31 g/dL (ref 30.0–36.0)
MCV: 83.4 fL (ref 78.0–100.0)
MONO ABS: 0.2 10*3/uL (ref 0.1–1.0)
MONOS PCT: 3 %
NEUTROS ABS: 5.1 10*3/uL (ref 1.7–7.7)
Neutrophils Relative %: 76 %
Platelets: 211 10*3/uL (ref 150–400)
RBC: 4.03 MIL/uL (ref 3.87–5.11)
RDW: 15.7 % — AB (ref 11.5–15.5)
WBC: 6.7 10*3/uL (ref 4.0–10.5)

## 2015-08-16 LAB — COMPREHENSIVE METABOLIC PANEL
ALBUMIN: 3.4 g/dL — AB (ref 3.5–5.0)
ALT: 15 U/L (ref 14–54)
ANION GAP: 8 (ref 5–15)
AST: 20 U/L (ref 15–41)
Alkaline Phosphatase: 76 U/L (ref 38–126)
BUN: 35 mg/dL — ABNORMAL HIGH (ref 6–20)
CO2: 22 mmol/L (ref 22–32)
Calcium: 9 mg/dL (ref 8.9–10.3)
Chloride: 109 mmol/L (ref 101–111)
Creatinine, Ser: 1.28 mg/dL — ABNORMAL HIGH (ref 0.44–1.00)
GFR calc Af Amer: 47 mL/min — ABNORMAL LOW (ref >60)
GFR calc non Af Amer: 40 mL/min — ABNORMAL LOW (ref >60)
GLUCOSE: 150 mg/dL — AB (ref 65–99)
POTASSIUM: 3.9 mmol/L (ref 3.5–5.1)
SODIUM: 139 mmol/L (ref 135–145)
Total Bilirubin: 0.3 mg/dL (ref 0.3–1.2)
Total Protein: 7.7 g/dL (ref 6.5–8.1)

## 2015-08-16 LAB — URINALYSIS, ROUTINE W REFLEX MICROSCOPIC
Bilirubin Urine: NEGATIVE
GLUCOSE, UA: NEGATIVE mg/dL
Hgb urine dipstick: NEGATIVE
KETONES UR: NEGATIVE mg/dL
Nitrite: NEGATIVE
PH: 5 (ref 5.0–8.0)
Protein, ur: NEGATIVE mg/dL
Specific Gravity, Urine: 1.024 (ref 1.005–1.030)

## 2015-08-16 LAB — URINE MICROSCOPIC-ADD ON

## 2015-08-16 MED ORDER — ALBUTEROL SULFATE (2.5 MG/3ML) 0.083% IN NEBU
5.0000 mg | INHALATION_SOLUTION | Freq: Once | RESPIRATORY_TRACT | Status: AC
  Administered 2015-08-16: 5 mg via RESPIRATORY_TRACT
  Filled 2015-08-16: qty 6

## 2015-08-16 MED ORDER — CEPHALEXIN 250 MG PO CAPS
500.0000 mg | ORAL_CAPSULE | Freq: Once | ORAL | Status: AC
  Administered 2015-08-16: 500 mg via ORAL
  Filled 2015-08-16: qty 2

## 2015-08-16 MED ORDER — CEPHALEXIN 500 MG PO CAPS: 500.0000 mg | ORAL_CAPSULE | Freq: Two times a day (BID) | ORAL | Status: DC

## 2015-08-16 NOTE — Discharge Instructions (Signed)
Push fluids, check in with her doctor next week.  Please follow with your primary care doctor in the next 2 days for a check-up. They must obtain records for further management.   Do not hesitate to return to the Emergency Department for any new, worsening or concerning symptoms.

## 2015-08-16 NOTE — ED Provider Notes (Signed)
CSN: [id number]     Arrival date & time 08/16/15  1100 History   First MD Initiated Contact with Patient 08/16/15 1230     Chief Complaint  Patient presents with  . Labs Only     (Consider location/radiation/quality/duration/timing/severity/associated sxs/prior Treatment) HPI  Blood pressure 127/56, pulse 63, temperature 98.4 F (36.9 C), temperature source Oral, resp. rate 14, height 5' 4.5" (1.638 m), weight 109.77 kg, SpO2 100 %.  Stephanie Mcdaniel is a 75 y.o. female presents to the ED for recheck of creatinine. Patient with acute kidney injury, she was seen yesterday and diagnosed with bronchitis, chief complaint yesterday was cough. No signs of infiltrate on chest x-ray, patient was treated with azithromycin, Tessalon and prednisone burst. She was shown to have been of elevated kidney function with a creatinine of 1.88, she was advised to follow with her primary care Dr. her wanting to since his office was closed today she came back to the ED for a checkup, states she feels much better and she's been drinking water frequently. She reports a slightly reduced urine output with no dysuria or hematuria.  Past Medical History  Diagnosis Date  . Hypertension   . Fibromyalgia   . Hyperlipidemia    Past Surgical History  Procedure Laterality Date  . Thyroidectomy    . Abdominal hysterectomy    . Back surgery    . Colon surgery    . Carpal tunnel release     No family history on file. Social History  Substance Use Topics  . Smoking status: Never Smoker   . Smokeless tobacco: None  . Alcohol Use: No   OB History    No data available     Review of Systems  10 systems reviewed and found to be negative, except as noted in the HPI.  Allergies  Aspirin; Contrast media; and Motrin  Home Medications   Prior to Admission medications   Medication Sig Start Date End Date Taking? Authorizing Provider  acetaminophen (TYLENOL) 500 MG tablet Take 500 mg by mouth every 6 (six) hours as  needed for moderate pain.    Yes Historical Provider, MD  Ascorbic Acid (VITAMIN C) 1000 MG tablet Take 1,000 mg by mouth daily.   Yes Historical Provider, MD  azithromycin (ZITHROMAX) 250 MG tablet Take 250 mg by mouth daily. Started 08/13/15 for 5 days ending 08/17/15 08/13/15  Yes Historical Provider, MD  benzonatate (TESSALON) 100 MG capsule Take 1 capsule (100 mg total) by mouth 3 (three) times daily as needed for cough. 08/15/15  Yes Leo Grosser, MD  clopidogrel (PLAVIX) 75 MG tablet Take 75 mg by mouth daily.   Yes Historical Provider, MD  metoprolol succinate (TOPROL-XL) 25 MG 24 hr tablet Take 12.5 mg by mouth daily.  03/13/14  Yes Historical Provider, MD  Olmesartan-Amlodipine-HCTZ (TRIBENZOR) 40-10-25 MG TABS Take 1 tablet by mouth daily.   Yes Historical Provider, MD  pantoprazole (PROTONIX) 40 MG tablet Take 40 mg by mouth daily.   Yes Historical Provider, MD  predniSONE (DELTASONE) 10 MG tablet Take 5 tablets (50 mg total) by mouth daily. 08/16/15  Yes Leo Grosser, MD  rosuvastatin (CRESTOR) 20 MG tablet Take 20 mg by mouth every evening.    Yes Historical Provider, MD  cephALEXin (KEFLEX) 500 MG capsule Take 1 capsule (500 mg total) by mouth 2 (two) times daily. 08/16/15   Lareta Bruneau, PA-C  traMADol (ULTRAM) 50 MG tablet Take 1 tablet (50 mg total) by mouth every 6 (six) hours  as needed for pain. Patient not taking: Reported on 03/30/2014 09/01/12   Charlann Lange, PA-C   BP 128/69 mmHg  Pulse 60  Temp(Src) 97.8 F (36.6 C) (Oral)  Resp 14  Ht 5' 4.5" (1.638 m)  Wt 109.77 kg  BMI 40.91 kg/m2  SpO2 100% Physical Exam  Constitutional: She is oriented to person, place, and time. She appears well-developed and well-nourished. No distress.  HENT:  Head: Normocephalic and atraumatic.  Mouth/Throat: Oropharynx is clear and moist.  Eyes: Conjunctivae and EOM are normal. Pupils are equal, round, and reactive to light.  Neck: Normal range of motion.  Cardiovascular: Normal rate, regular  rhythm and intact distal pulses.   Pulmonary/Chest: Effort normal and breath sounds normal. No stridor. No respiratory distress. She has no wheezes. She has no rales. She exhibits no tenderness.  Coughing frequently  Abdominal: Soft. Bowel sounds are normal. She exhibits no distension and no mass. There is no tenderness. There is no rebound and no guarding.  Genitourinary:  No CVA tenderness to percussion bilaterally  Musculoskeletal: Normal range of motion.  Neurological: She is alert and oriented to person, place, and time.  Skin: She is not diaphoretic.  Psychiatric: She has a normal mood and affect.  Nursing note and vitals reviewed.   ED Course  Procedures (including critical care time) Labs Review Labs Reviewed  COMPREHENSIVE METABOLIC PANEL - Abnormal; Notable for the following:    Glucose, Bld 150 (*)    BUN 35 (*)    Creatinine, Ser 1.28 (*)    Albumin 3.4 (*)    GFR calc non Af Amer 40 (*)    GFR calc Af Amer 47 (*)    All other components within normal limits  CBC WITH DIFFERENTIAL/PLATELET - Abnormal; Notable for the following:    Hemoglobin 10.4 (*)    HCT 33.6 (*)    MCH 25.8 (*)    RDW 15.7 (*)    All other components within normal limits  URINALYSIS, ROUTINE W REFLEX MICROSCOPIC (NOT AT [facility]) - Abnormal; Notable for the following:    Leukocytes, UA MODERATE (*)    All other components within normal limits  URINE MICROSCOPIC-ADD ON - Abnormal; Notable for the following:    Squamous Epithelial / LPF 6-30 (*)    Bacteria, UA FEW (*)    Casts HYALINE CASTS (*)    All other components within normal limits  URINE CULTURE    Imaging Review Dg Chest 2 View  08/15/2015  CLINICAL DATA:  Cough, shortness of breath, chest pain since yesterday. Hypertension. EXAM: CHEST  2 VIEW COMPARISON:  06/27/2010 FINDINGS: Linear atelectasis or scarring in the left base. Right lung is clear. Heart is normal size. No effusions. No acute bony abnormality. IMPRESSION: Left basilar  scarring or atelectasis. No acute findings. Electronically Signed   By: Rolm Baptise M.D.   On: 08/15/2015 14:41   I have personally reviewed and evaluated these images and lab results as part of my medical decision-making.   EKG Interpretation None      MDM   Final diagnoses:  Bronchitis  AKI (acute kidney injury) ([place])  UTI (lower urinary tract infection)    Filed Vitals:   08/16/15 1400 08/16/15 1423 08/16/15 1431 08/16/15 1435  BP:   128/69   Pulse: 67 60 60   Temp:    97.8 F (36.6 C)  TempSrc:    Oral  Resp:      Height:      Weight:  SpO2: 99% 100% 100%     Medications  albuterol (PROVENTIL) (2.5 MG/3ML) 0.083% nebulizer solution 5 mg (5 mg Nebulization Given 08/16/15 1335)  cephALEXin (KEFLEX) capsule 500 mg (500 mg Oral Given 08/16/15 1438)    Stephanie Mcdaniel is 75 y.o. female presenting For recheck of her creatinine. Patient states that she's also had a little difficulty urinating lately, I think this may be secondary to dehydration but will check a urine, is a slightly contaminated specimen however she has a few bacteria with 6-30 whites and a moderate amount of leukocytes, will start on Keflex for UTI, urine culture pending. Patient advised to follow closely with primary care physician.  Evaluation does not show pathology that would require ongoing emergent intervention or inpatient treatment. Pt is hemodynamically stable and mentating appropriately. Discussed findings and plan with patient/guardian, who agrees with care plan. All questions answered. Return precautions discussed and outpatient follow up given.      Monico Blitz, PA-C 08/16/15 [place], MD 08/17/15 [phone]

## 2015-08-16 NOTE — ED Notes (Addendum)
Per Pt, Pt was discharged from hospital last night with diagnosis of bronchitis and Kidney problems. Pt reports being told by MD Harwani to return for blood draw due to abnormal kidney levels. Pt denies any other symptoms at this time. Reports painful cough due to bronchitis that has continued since admission into the hospital.

## 2015-08-17 LAB — URINE CULTURE

## 2015-08-21 ENCOUNTER — Other Ambulatory Visit: Payer: Self-pay | Admitting: Cardiology

## 2015-08-21 DIAGNOSIS — Z1231 Encounter for screening mammogram for malignant neoplasm of breast: Secondary | ICD-10-CM

## 2015-09-17 ENCOUNTER — Ambulatory Visit
Admission: RE | Admit: 2015-09-17 | Discharge: 2015-09-17 | Disposition: A | Discharge status: Home / Self Care | Payer: Medicare Other | Source: Ambulatory Visit | Attending: Cardiology | Admitting: Cardiology

## 2015-09-17 DIAGNOSIS — Z1231 Encounter for screening mammogram for malignant neoplasm of breast: Secondary | ICD-10-CM

## 2015-11-02 ENCOUNTER — Encounter (HOSPITAL_COMMUNITY): Payer: Self-pay

## 2015-11-02 ENCOUNTER — Emergency Department (HOSPITAL_COMMUNITY)
Admission: EM | Admit: 2015-11-02 | Discharge: 2015-11-02 | Disposition: A | Discharge status: Home / Self Care | Payer: Medicare Other | Attending: Emergency Medicine | Admitting: Emergency Medicine

## 2015-11-02 ENCOUNTER — Emergency Department (HOSPITAL_COMMUNITY): Payer: Medicare Other

## 2015-11-02 DIAGNOSIS — W1830XA Fall on same level, unspecified, initial encounter: Secondary | ICD-10-CM | POA: Diagnosis not present

## 2015-11-02 DIAGNOSIS — Y999 Unspecified external cause status: Secondary | ICD-10-CM | POA: Insufficient documentation

## 2015-11-02 DIAGNOSIS — Y929 Unspecified place or not applicable: Secondary | ICD-10-CM | POA: Insufficient documentation

## 2015-11-02 DIAGNOSIS — Y939 Activity, unspecified: Secondary | ICD-10-CM | POA: Diagnosis not present

## 2015-11-02 DIAGNOSIS — Z79899 Other long term (current) drug therapy: Secondary | ICD-10-CM | POA: Diagnosis not present

## 2015-11-02 DIAGNOSIS — I10 Essential (primary) hypertension: Secondary | ICD-10-CM | POA: Insufficient documentation

## 2015-11-02 DIAGNOSIS — M25561 Pain in right knee: Secondary | ICD-10-CM | POA: Diagnosis present

## 2015-11-02 MED ORDER — OXYCODONE-ACETAMINOPHEN 5-325 MG PO TABS
1.0000 | ORAL_TABLET | Freq: Once | ORAL | Status: AC
Start: 2015-11-02 — End: 2015-11-02
  Administered 2015-11-02: 1 via ORAL
  Filled 2015-11-02: qty 1

## 2015-11-02 NOTE — Discharge Instructions (Signed)
It was my pleasure taking care of you today!  Please follow up with your orthopedic physician for discussion of today's diagnosis.  Tylenol as needed for pain. Ice affected area for additional pain/swelling relief.  Elevate leg as much as possible throughout the day- this will also help with pain/swelling.  Return to ER for new or worsening symptoms, any additional concerns.   COLD THERAPY DIRECTIONS:  Ice or gel packs can be used to reduce both pain and swelling. Ice is the most helpful within the first 24 to 48 hours after an injury or flareup from overusing a muscle or joint.  Ice is effective, has very few side effects, and is safe for most people to use.   If you expose your skin to cold temperatures for too long or without the proper protection, you can damage your skin or nerves. Watch for signs of skin damage due to cold.   HOME CARE INSTRUCTIONS  Follow these tips to use ice and cold packs safely.  Place a dry or damp towel between the ice and skin. A damp towel will cool the skin more quickly, so you may need to shorten the time that the ice is used.  For a more rapid response, add gentle compression to the ice.  Ice for no more than 10 to 20 minutes at a time. The bonier the area you are icing, the less time it will take to get the benefits of ice.  Check your skin after 5 minutes to make sure there are no signs of a poor response to cold or skin damage.  Rest 20 minutes or more in between uses.  Once your skin is numb, you can end your treatment. You can test numbness by very lightly touching your skin. The touch should be so light that you do not see the skin dimple from the pressure of your fingertip. When using ice, most people will feel these normal sensations in this order: cold, burning, aching, and numbness.

## 2015-11-02 NOTE — ED Triage Notes (Signed)
Pt reports right knee pain that started last week. This morning she stated "I missed a step and I heard a pop." She reports pain with ambulation.

## 2015-11-02 NOTE — ED Provider Notes (Signed)
[facility] DEPT Provider Note   CSN: [id number] Arrival date & time: 11/02/15  1108     History   Chief Complaint Chief Complaint  Patient presents with  . Knee Pain    HPI Stephanie Mcdaniel is a 75 y.o. female.  The history is provided by the patient and medical records. No language interpreter was used.  Knee Pain   Pertinent negatives include no numbness.   Stephanie Mcdaniel is a 75 y.o. female  with a PMH of HTN, HLD who presents to the Emergency Department complaining of acute onset of right knee pain after missing a step last week. Pain has gradually worsened. Patient endorses a pop with onset of symptoms and occasionally knee catching. Pain worse with ambulation. Improved with rest. No numbness, tingling or weakness. Tramadol this morning with no relief in symptoms. Typically ambulatory without assistance, but has been using a cane to ambulate since incident.   Past Medical History:  Diagnosis Date  . Fibromyalgia   . Hyperlipidemia   . Hypertension     There are no active problems to display for this patient.   Past Surgical History:  Procedure Laterality Date  . ABDOMINAL HYSTERECTOMY    . BACK SURGERY    . CARPAL TUNNEL RELEASE    . COLON SURGERY    . THYROIDECTOMY      OB History    No data available       Home Medications    Prior to Admission medications   Medication Sig Start Date End Date Taking? Authorizing Provider  acetaminophen (TYLENOL) 500 MG tablet Take 500 mg by mouth every 6 (six) hours as needed for moderate pain.    Yes Historical Provider, MD  Ascorbic Acid (VITAMIN C) 1000 MG tablet Take 1,000 mg by mouth daily.   Yes Historical Provider, MD  benzonatate (TESSALON) 100 MG capsule Take 1 capsule (100 mg total) by mouth 3 (three) times daily as needed for cough. 08/15/15  Yes Leo Grosser, MD  clopidogrel (PLAVIX) 75 MG tablet Take 75 mg by mouth daily.   Yes Historical Provider, MD  metoprolol succinate (TOPROL-XL) 25 MG 24 hr tablet  Take 12.5 mg by mouth daily.  03/13/14  Yes Historical Provider, MD  Olmesartan-Amlodipine-HCTZ (TRIBENZOR) 40-10-25 MG TABS Take 1 tablet by mouth daily.   Yes Historical Provider, MD  pantoprazole (PROTONIX) 40 MG tablet Take 40 mg by mouth daily.   Yes Historical Provider, MD  predniSONE (DELTASONE) 10 MG tablet Take 5 tablets (50 mg total) by mouth daily. 08/16/15  Yes Leo Grosser, MD  rosuvastatin (CRESTOR) 20 MG tablet Take 20 mg by mouth every evening.    Yes Historical Provider, MD  azithromycin (ZITHROMAX) 250 MG tablet Take 250 mg by mouth daily. Started 08/13/15 for 5 days ending 08/17/15 08/13/15   Historical Provider, MD  cephALEXin (KEFLEX) 500 MG capsule Take 1 capsule (500 mg total) by mouth 2 (two) times daily. Patient not taking: Reported on 11/02/2015 08/16/15   Elmyra Ricks Pisciotta, PA-C  traMADol (ULTRAM) 50 MG tablet Take 1 tablet (50 mg total) by mouth every 6 (six) hours as needed for pain. Patient not taking: Reported on 03/30/2014 09/01/12   Charlann Lange, PA-C    Family History No family history on file.  Social History Social History  Substance Use Topics  . Smoking status: Never Smoker  . Smokeless tobacco: Never Used  . Alcohol use No     Allergies   Aspirin; Contrast media [iodinated diagnostic agents]; and  Motrin [ibuprofen]   Review of Systems Review of Systems  Musculoskeletal: Positive for arthralgias and myalgias.  Skin: Negative for color change.  Neurological: Negative for numbness.     Physical Exam Updated Vital Signs BP 118/100   Pulse (!) 52   Temp 97.7 F (36.5 C) (Oral)   Resp 18   SpO2 100%   Physical Exam  Constitutional: She is oriented to person, place, and time. She appears well-developed and well-nourished. No distress.  HENT:  Head: Normocephalic and atraumatic.  Cardiovascular: Normal rate, regular rhythm and normal heart sounds.   Pulmonary/Chest: Effort normal and breath sounds normal. No respiratory distress.  Musculoskeletal:    Right knee: No gross deformity noted. Knee with full ROM. + medial joint line tenderness with mild associated swelling. No abnormal alignment or patellar mobility. No bruising, erythema, or warmth overlaying the joint. No varus/valgus laxity. Negative drawer's, negative Lachman's, negative McMurray's, no crepitus.  2+ DP pulses bilaterally. All compartments are soft. Sensation intact distal to injury.  Neurological: She is alert and oriented to person, place, and time.  Skin: Skin is warm and dry.  Nursing note and vitals reviewed.    ED Treatments / Results  Labs (all labs ordered are listed, but only abnormal results are displayed) Labs Reviewed - No data to display  EKG  EKG Interpretation None       Radiology Dg Knee Complete 4 Views Right  Result Date: 11/02/2015 CLINICAL DATA:  Fall today with anterior knee swelling. Initial encounter. EXAM: RIGHT KNEE - COMPLETE 4+ VIEW COMPARISON:  None. FINDINGS: No evidence of fracture, dislocation, or joint effusion. Mild degenerative marginal spurring without detectable joint narrowing. IMPRESSION: 1. No acute fracture. 2. Mild degenerative spurring. Electronically Signed   By: Monte Fantasia M.D.   On: 11/02/2015 12:43    Procedures Procedures (including critical care time)  Medications Ordered in ED Medications  oxyCODONE-acetaminophen (PERCOCET/ROXICET) 5-325 MG per tablet 1 tablet (not administered)     Initial Impression / Assessment and Plan / ED Course  I have reviewed the triage vital signs and the nursing notes.  Pertinent labs & imaging results that were available during my care of the patient were reviewed by me and considered in my medical decision making (see chart for details).  Clinical Course   Stephanie Mcdaniel is a 75 y.o. female who presents to ED for right knee pain. On exam, RLE is NVI. TTP of medial joint line and patient reports intermittent catching. X-ray reviewed with no acute bony abnormalities. Knee  sleeve provided in ED. Patient is followed by orthopedics, Dr. Ninfa Linden. Call to schedule orthopedic follow-up. Patient feels comfortable with discharge to home and able to ambulate safely. Symptomatic home care instructions discussed and all questions answered.  Patient discussed with Dr. Christy Gentles who agrees with treatment plan.    Final Clinical Impressions(s) / ED Diagnoses   Final diagnoses:  Knee pain, right    New Prescriptions New Prescriptions   No medications on file     [facility], PA-C 11/02/15 [place], MD 11/05/15 [phone]

## 2015-11-21 ENCOUNTER — Ambulatory Visit (INDEPENDENT_AMBULATORY_CARE_PROVIDER_SITE_OTHER): Payer: Medicare Other | Admitting: Orthopaedic Surgery

## 2015-11-21 DIAGNOSIS — M7051 Other bursitis of knee, right knee: Secondary | ICD-10-CM | POA: Diagnosis not present

## 2015-12-19 ENCOUNTER — Ambulatory Visit (INDEPENDENT_AMBULATORY_CARE_PROVIDER_SITE_OTHER): Payer: Medicare Other | Admitting: Physician Assistant

## 2015-12-19 ENCOUNTER — Other Ambulatory Visit (INDEPENDENT_AMBULATORY_CARE_PROVIDER_SITE_OTHER): Payer: Self-pay | Admitting: Physician Assistant

## 2015-12-19 DIAGNOSIS — G8929 Other chronic pain: Secondary | ICD-10-CM

## 2015-12-19 DIAGNOSIS — M25561 Pain in right knee: Principal | ICD-10-CM

## 2015-12-19 NOTE — Progress Notes (Signed)
   Office Visit Note   Patient: Stephanie Mcdaniel           Date of Birth: 09/14/1940           MRN: [id number] Visit Date: 12/19/2015              Requested by: Charolette Forward, MD [phone] [place], [place] [address] PCP: Charolette Forward, MD   Assessment & Plan: Visit Diagnoses: No diagnosis found.  Plan: MRI right knee rule out internal derangement. She is somewhat claustrophobic we'll require open MRI. Again radiographs only show mild arthritic changes of her right knee.  Follow-Up Instructions: Return in about 2 weeks (around 01/02/2016), or after MRI.   Orders:  No orders of the defined types were placed in this encounter.  No orders of the defined types were placed in this encounter.     Procedures: No procedures performed   Clinical Data: No additional findings.   Subjective: Chief Complaint  Patient presents with  . Right Knee - Pain    Patient had injection 11/21/15, states this didn't really work for her.She still has quite a bit of pain    HPI  Mrs. Burnsed returns today stating that her knee pain. Pain 8 out of 10 at its worst. She denies any Mechanical symptoms since the first injection. States the last injection gave her no relief this was given in the [place] region. Again her pain started acutely with a painful pop in her knee without trauma to the knee.   Review of Systems   Objective: Vital Signs: There were no vitals taken for this visit.  Physical Exam  Constitutional: She is oriented to person, place, and time. She appears well-developed and well-nourished. No distress.  Musculoskeletal:       Right knee: She exhibits no effusion.  Neurological: She is alert and oriented to person, place, and time.  Skin: Skin is warm and dry.      Right Knee Exam   Tenderness  The patient is experiencing tenderness in the pes anserinus, medial joint line and patella.  Range of Motion  Extension: 0  Flexion: 110   Tests  Varus:  negative Valgus: negative  Other  Erythema: absent Swelling: none Other tests: no effusion present  Comments:  Crepitus patellar region with Passive Range of Motion      Specialty Comments:  No specialty comments available.  Imaging: No results found.   PMFS History: There are no active problems to display for this patient.  Past Medical History:  Diagnosis Date  . Fibromyalgia   . Hyperlipidemia   . Hypertension     No family history on file.  Past Surgical History:  Procedure Laterality Date  . ABDOMINAL HYSTERECTOMY    . BACK SURGERY    . CARPAL TUNNEL RELEASE    . COLON SURGERY    . THYROIDECTOMY     Social History   Occupational History  . Not on file.   Social History Main Topics  . Smoking status: Never Smoker  . Smokeless tobacco: Never Used  . Alcohol use No  . Drug use: No  . Sexual activity: Not on file

## 2016-01-01 ENCOUNTER — Ambulatory Visit
Admission: RE | Admit: 2016-01-01 | Discharge: 2016-01-01 | Disposition: A | Discharge status: Home / Self Care | Payer: Medicare Other | Source: Ambulatory Visit | Attending: Physician Assistant | Admitting: Physician Assistant

## 2016-01-01 DIAGNOSIS — M25561 Pain in right knee: Principal | ICD-10-CM

## 2016-01-01 DIAGNOSIS — G8929 Other chronic pain: Secondary | ICD-10-CM

## 2016-01-16 ENCOUNTER — Ambulatory Visit (INDEPENDENT_AMBULATORY_CARE_PROVIDER_SITE_OTHER): Payer: Medicare Other | Admitting: Orthopaedic Surgery

## 2016-01-16 DIAGNOSIS — M1711 Unilateral primary osteoarthritis, right knee: Secondary | ICD-10-CM | POA: Diagnosis not present

## 2016-01-16 DIAGNOSIS — M25561 Pain in right knee: Secondary | ICD-10-CM | POA: Diagnosis not present

## 2016-01-16 DIAGNOSIS — G8929 Other chronic pain: Secondary | ICD-10-CM

## 2016-01-16 NOTE — Progress Notes (Signed)
   Office Visit Note   Patient: Stephanie Mcdaniel           Date of Birth: April 08, 1940           MRN: [id number] Visit Date: 01/16/2016              Requested by: Charolette Forward, MD [phone] [place], [place] [address] PCP: Charolette Forward, MD   Assessment & Plan: Visit Diagnoses:  1. Chronic pain of right knee   2. Unilateral primary osteoarthritis, right knee     Plan: Given her MRI findings are recommendation would be a right total knee replacement. We replaced her left knee about 7 years ago. She is not inches and that right now. She has tried and failed other forms conservative treatment. I told her work on quad strengthening exercises and weight loss. She can also try a knee sleeve. If he gets bad enough for she knows to give [place] a call and we can set her up for a right knee replacement.  Follow-Up Instructions: Return if symptoms worsen or fail to improve.   Orders:  No orders of the defined types were placed in this encounter.  No orders of the defined types were placed in this encounter.     Procedures: No procedures performed   Clinical Data: No additional findings.   Subjective: Chief Complaint  Patient presents with  . Right Knee - Follow-up    Patient here to discuss MRI results today.   She is here for review of her right knee MRI. Her main pain is medial joint line. It hurts mainly with an activity and when she first gets up to walk it gives her quite a bit of problems. HPI  Review of Systems   Objective: Vital Signs: There were no vitals taken for this visit.  Physical Exam She is alert and oriented 3. Ortho Exam Her right knee shows a slight varus deformity. There is a moderate effusion. She has painful range of motion of her knee. She walks with a limp when she first gets up. She has pain over the medial tibial plateau as well. Specialty Comments:  No specialty comments available.  Imaging: No results found. MRI is reviewed  with her of her right knee. She has chronic medial lateral meniscal tearing. There is a full-thickness cartilage loss in the medial compartment on the femur and the tibial plateau. There is also stress changes in the tibial plateau on the medial side suggesting a stress fractions area from the arthritic changes throughout her knee.  PMFS History: There are no active problems to display for this patient.  Past Medical History:  Diagnosis Date  . Fibromyalgia   . Hyperlipidemia   . Hypertension     No family history on file.  Past Surgical History:  Procedure Laterality Date  . ABDOMINAL HYSTERECTOMY    . BACK SURGERY    . CARPAL TUNNEL RELEASE    . COLON SURGERY    . THYROIDECTOMY     Social History   Occupational History  . Not on file.   Social History Main Topics  . Smoking status: Never Smoker  . Smokeless tobacco: Never Used  . Alcohol use No  . Drug use: No  . Sexual activity: Not on file

## 2016-02-13 ENCOUNTER — Other Ambulatory Visit: Payer: Self-pay | Admitting: Cardiology

## 2016-02-13 DIAGNOSIS — R079 Chest pain, unspecified: Secondary | ICD-10-CM

## 2016-02-22 ENCOUNTER — Ambulatory Visit (HOSPITAL_COMMUNITY)
Admission: RE | Admit: 2016-02-22 | Discharge: 2016-02-22 | Disposition: A | Discharge status: Home / Self Care | Payer: Medicare Other | Source: Ambulatory Visit | Attending: Cardiology | Admitting: Cardiology

## 2016-02-22 DIAGNOSIS — R079 Chest pain, unspecified: Secondary | ICD-10-CM | POA: Diagnosis not present

## 2016-02-22 MED ORDER — REGADENOSON 0.4 MG/5ML IV SOLN
INTRAVENOUS | Status: AC
  Administered 2016-02-22: 0.4 mg
  Filled 2016-02-22: qty 5

## 2016-02-22 MED ORDER — TECHNETIUM TC 99M TETROFOSMIN IV KIT
10.0000 | PACK | Freq: Once | INTRAVENOUS | Status: AC | PRN
  Administered 2016-02-22: 10 via INTRAVENOUS

## 2016-02-22 MED ORDER — REGADENOSON 0.4 MG/5ML IV SOLN: 0.4000 mg | Freq: Once | INTRAVENOUS | Status: DC

## 2016-02-22 MED ORDER — TECHNETIUM TC 99M TETROFOSMIN IV KIT
30.0000 | PACK | Freq: Once | INTRAVENOUS | Status: AC | PRN
  Administered 2016-02-22: 30 via INTRAVENOUS

## 2016-03-06 ENCOUNTER — Ambulatory Visit (HOSPITAL_COMMUNITY)
Admission: RE | Admit: 2016-03-06 | Discharge: 2016-03-06 | Disposition: A | Discharge status: Home / Self Care | Payer: Medicare Other | Source: Ambulatory Visit | Attending: Cardiology | Admitting: Cardiology

## 2016-03-06 ENCOUNTER — Encounter (HOSPITAL_COMMUNITY): Payer: Self-pay | Admitting: Cardiology

## 2016-03-06 ENCOUNTER — Encounter (HOSPITAL_COMMUNITY)
Admission: RE | Disposition: A | Discharge status: Home / Self Care | Payer: Self-pay | Source: Ambulatory Visit | Attending: Cardiology

## 2016-03-06 DIAGNOSIS — Z79899 Other long term (current) drug therapy: Secondary | ICD-10-CM | POA: Diagnosis not present

## 2016-03-06 DIAGNOSIS — R7303 Prediabetes: Secondary | ICD-10-CM | POA: Diagnosis not present

## 2016-03-06 DIAGNOSIS — E785 Hyperlipidemia, unspecified: Secondary | ICD-10-CM | POA: Diagnosis not present

## 2016-03-06 DIAGNOSIS — I10 Essential (primary) hypertension: Secondary | ICD-10-CM | POA: Diagnosis not present

## 2016-03-06 DIAGNOSIS — Z7902 Long term (current) use of antithrombotics/antiplatelets: Secondary | ICD-10-CM | POA: Diagnosis not present

## 2016-03-06 DIAGNOSIS — R9439 Abnormal result of other cardiovascular function study: Secondary | ICD-10-CM | POA: Diagnosis present

## 2016-03-06 DIAGNOSIS — I2510 Atherosclerotic heart disease of native coronary artery without angina pectoris: Secondary | ICD-10-CM | POA: Insufficient documentation

## 2016-03-06 HISTORY — PX: CARDIAC CATHETERIZATION: SHX172

## 2016-03-06 SURGERY — LEFT HEART CATH AND CORONARY ANGIOGRAPHY

## 2016-03-06 MED ORDER — LIDOCAINE HCL (PF) 1 % IJ SOLN
INTRAMUSCULAR | Status: DC | PRN
  Administered 2016-03-06: 20 mL

## 2016-03-06 MED ORDER — MIDAZOLAM HCL 2 MG/2ML IJ SOLN
INTRAMUSCULAR | Status: AC
  Filled 2016-03-06: qty 2

## 2016-03-06 MED ORDER — SODIUM CHLORIDE 0.9 % IV SOLN: 250.0000 mL | INTRAVENOUS | Status: DC | PRN

## 2016-03-06 MED ORDER — ACETAMINOPHEN 325 MG PO TABS: 650.0000 mg | ORAL_TABLET | ORAL | Status: DC | PRN

## 2016-03-06 MED ORDER — ONDANSETRON HCL 4 MG/2ML IJ SOLN: 4.0000 mg | Freq: Four times a day (QID) | INTRAMUSCULAR | Status: DC | PRN

## 2016-03-06 MED ORDER — SODIUM CHLORIDE 0.9 % WEIGHT BASED INFUSION: 1.0000 mL/kg/h | INTRAVENOUS | Status: DC

## 2016-03-06 MED ORDER — CLOPIDOGREL BISULFATE 75 MG PO TABS
75.0000 mg | ORAL_TABLET | Freq: Once | ORAL | Status: AC
  Administered 2016-03-06: 75 mg via ORAL

## 2016-03-06 MED ORDER — DIPHENHYDRAMINE HCL 50 MG/ML IJ SOLN
25.0000 mg | INTRAMUSCULAR | Status: AC
  Administered 2016-03-06: 25 mg via INTRAVENOUS

## 2016-03-06 MED ORDER — SODIUM CHLORIDE 0.9% FLUSH: 3.0000 mL | Freq: Two times a day (BID) | INTRAVENOUS | Status: DC

## 2016-03-06 MED ORDER — LIDOCAINE HCL (PF) 1 % IJ SOLN
INTRAMUSCULAR | Status: AC
  Filled 2016-03-06: qty 30

## 2016-03-06 MED ORDER — FENTANYL CITRATE (PF) 100 MCG/2ML IJ SOLN
INTRAMUSCULAR | Status: AC
  Filled 2016-03-06: qty 2

## 2016-03-06 MED ORDER — METHYLPREDNISOLONE SODIUM SUCC 125 MG IJ SOLR
125.0000 mg | INTRAMUSCULAR | Status: AC
  Administered 2016-03-06: 125 mg via INTRAVENOUS

## 2016-03-06 MED ORDER — FENTANYL CITRATE (PF) 100 MCG/2ML IJ SOLN
INTRAMUSCULAR | Status: DC | PRN
  Administered 2016-03-06: 25 ug via INTRAVENOUS

## 2016-03-06 MED ORDER — MIDAZOLAM HCL 2 MG/2ML IJ SOLN
INTRAMUSCULAR | Status: DC | PRN
  Administered 2016-03-06: 1 mg via INTRAVENOUS

## 2016-03-06 MED ORDER — DIPHENHYDRAMINE HCL 50 MG/ML IJ SOLN
INTRAMUSCULAR | Status: AC
  Administered 2016-03-06: 25 mg via INTRAVENOUS
  Filled 2016-03-06: qty 1

## 2016-03-06 MED ORDER — SODIUM CHLORIDE 0.9% FLUSH: 3.0000 mL | INTRAVENOUS | Status: DC | PRN

## 2016-03-06 MED ORDER — HEPARIN (PORCINE) IN NACL 2-0.9 UNIT/ML-% IJ SOLN
INTRAMUSCULAR | Status: AC
  Filled 2016-03-06: qty 1500

## 2016-03-06 MED ORDER — HEPARIN (PORCINE) IN NACL 2-0.9 UNIT/ML-% IJ SOLN
INTRAMUSCULAR | Status: DC | PRN
  Administered 2016-03-06: 1500 mL

## 2016-03-06 MED ORDER — FAMOTIDINE IN NACL 20-0.9 MG/50ML-% IV SOLN
INTRAVENOUS | Status: AC
  Administered 2016-03-06: 20 mg via INTRAVENOUS
  Filled 2016-03-06: qty 50

## 2016-03-06 MED ORDER — IOPAMIDOL (ISOVUE-370) INJECTION 76%
INTRAVENOUS | Status: DC | PRN
  Administered 2016-03-06: 70 mL via INTRA_ARTERIAL

## 2016-03-06 MED ORDER — METHYLPREDNISOLONE SODIUM SUCC 125 MG IJ SOLR
INTRAMUSCULAR | Status: AC
  Administered 2016-03-06: 125 mg via INTRAVENOUS
  Filled 2016-03-06: qty 2

## 2016-03-06 MED ORDER — SODIUM CHLORIDE 0.9 % WEIGHT BASED INFUSION
3.0000 mL/kg/h | INTRAVENOUS | Status: AC
  Administered 2016-03-06: 3 mL/kg/h via INTRAVENOUS

## 2016-03-06 MED ORDER — SODIUM CHLORIDE 0.9 % IV SOLN: INTRAVENOUS | Status: AC

## 2016-03-06 MED ORDER — FAMOTIDINE IN NACL 20-0.9 MG/50ML-% IV SOLN
20.0000 mg | INTRAVENOUS | Status: AC
  Administered 2016-03-06: 20 mg via INTRAVENOUS

## 2016-03-06 MED ORDER — IOPAMIDOL (ISOVUE-370) INJECTION 76%
INTRAVENOUS | Status: AC
  Filled 2016-03-06: qty 100

## 2016-03-06 MED ORDER — CLOPIDOGREL BISULFATE 75 MG PO TABS
ORAL_TABLET | ORAL | Status: AC
  Administered 2016-03-06: 75 mg via ORAL
  Filled 2016-03-06: qty 1

## 2016-03-06 SURGICAL SUPPLY — 8 items
CATH INFINITI 5FR MULTPACK ANG (CATHETERS) ×2 IMPLANT
HOVERMATT SINGLE USE (MISCELLANEOUS) ×2 IMPLANT
KIT HEART LEFT (KITS) ×3 IMPLANT
PACK CARDIAC CATHETERIZATION (CUSTOM PROCEDURE TRAY) ×3 IMPLANT
SHEATH PINNACLE 5F 10CM (SHEATH) ×2 IMPLANT
SYR MEDRAD MARK V 150ML (SYRINGE) ×3 IMPLANT
TRANSDUCER W/STOPCOCK (MISCELLANEOUS) ×3 IMPLANT
WIRE EMERALD 3MM-J .035X150CM (WIRE) ×2 IMPLANT

## 2016-03-06 NOTE — H&P (Signed)
Typed H&P in the chart needs to be scanned 

## 2016-03-06 NOTE — Discharge Instructions (Signed)
Femoral Site Care °Introduction °Refer to this sheet in the next few weeks. These instructions provide you with information about caring for yourself after your procedure. Your health care provider may also give you more specific instructions. Your treatment has been planned according to current medical practices, but problems sometimes occur. Call your health care provider if you have any problems or questions after your procedure. °What can I expect after the procedure? °After your procedure, it is typical to have the following: °· Bruising at the site that usually fades within 1-2 weeks. °· Blood collecting in the tissue (hematoma) that may be painful to the touch. It should usually decrease in size and tenderness within 1-2 weeks. °Follow these instructions at home: °· Take medicines only as directed by your health care provider. °· You may shower 24-48 hours after the procedure or as directed by your health care provider. Remove the bandage (dressing) and gently wash the site with plain soap and water. Pat the area dry with a clean towel. Do not rub the site, because this may cause bleeding. °· Do not take baths, swim, or use a hot tub until your health care provider approves. °· Check your insertion site every day for redness, swelling, or drainage. °· Do not apply powder or lotion to the site. °· Limit use of stairs to twice a day for the first 2-3 days or as directed by your health care provider. °· Do not squat for the first 2-3 days or as directed by your health care provider. °· Do not lift over 10 lb (4.5 kg) for 5 days after your procedure or as directed by your health care provider. °· Ask your health care provider when it is okay to: °¨ Return to work or school. °¨ Resume usual physical activities or sports. °¨ Resume sexual activity. °· Do not drive home if you are discharged the same day as the procedure. Have someone else drive you. °· You may drive 24 hours after the procedure unless otherwise  instructed by your health care provider. °· Do not operate machinery or power tools for 24 hours after the procedure or as directed by your health care provider. °· If your procedure was done as an outpatient procedure, which means that you went home the same day as your procedure, a responsible adult should be with you for the first 24 hours after you arrive home. °· Keep all follow-up visits as directed by your health care provider. This is important. °Contact a health care provider if: °· You have a fever. °· You have chills. °· You have increased bleeding from the site. Hold pressure on the site. °Get help right away if: °· You have unusual pain at the site. °· You have redness, warmth, or swelling at the site. °· You have drainage (other than a small amount of blood on the dressing) from the site. °· The site is bleeding, and the bleeding does not stop after 30 minutes of holding steady pressure on the site. °· Your leg or foot becomes pale, cool, tingly, or numb. °This information is not intended to replace advice given to you by your health care provider. Make sure you discuss any questions you have with your health care provider. °Document Released: 09/30/2013 Document Revised: 07/05/2015 Document Reviewed: 08/16/2013 °© 2017 [employer]vier ° °

## 2016-03-06 NOTE — Interval H&P Note (Signed)
Cath Lab Visit (complete for each Cath Lab visit)  Clinical Evaluation Leading to the Procedure:   ACS: No.  Non-ACS:    Anginal Classification: CCS III  Anti-ischemic medical therapy: Maximal Therapy (2 or more classes of medications)  Non-Invasive Test Results: Intermediate-risk stress test findings: cardiac mortality 1-3%/year  Prior CABG: No previous CABG      History and Physical Interval Note:  03/06/2016 7:29 AM  Stephanie Mcdaniel  has presented today for surgery, with the diagnosis of abnormal stress test - cp  The various methods of treatment have been discussed with the patient and family. After consideration of risks, benefits and other options for treatment, the patient has consented to  Procedure(s): Left Heart Cath and Coronary Angiography (N/A) as a surgical intervention .  The patient's history has been reviewed, patient examined, no change in status, stable for surgery.  I have reviewed the patient's chart and labs.  Questions were answered to the patient's satisfaction.     Charolette Forward

## 2016-03-06 NOTE — Progress Notes (Addendum)
Site area:RFA  Site Prior to Removal:  Level 0 Pressure Applied For: 20 min Manual:   yes Patient Status During Pull:  stable Post Pull Site:  Level 0 Post Pull Instructions Given:  yes Post Pull Pulses Present: palpable Dressing Applied:  tegaderm Bedrest begins @ 0905 till 1305 Comments:   

## 2016-08-07 ENCOUNTER — Other Ambulatory Visit: Payer: Self-pay | Admitting: Cardiology

## 2016-08-07 DIAGNOSIS — Z1231 Encounter for screening mammogram for malignant neoplasm of breast: Secondary | ICD-10-CM

## 2016-09-17 ENCOUNTER — Ambulatory Visit
Admission: RE | Admit: 2016-09-17 | Discharge: 2016-09-17 | Disposition: A | Discharge status: Home / Self Care | Payer: Medicare Other | Source: Ambulatory Visit | Attending: Cardiology | Admitting: Cardiology

## 2016-09-17 DIAGNOSIS — Z1231 Encounter for screening mammogram for malignant neoplasm of breast: Secondary | ICD-10-CM

## 2017-08-10 ENCOUNTER — Other Ambulatory Visit: Payer: Self-pay | Admitting: Cardiology

## 2017-08-10 DIAGNOSIS — Z1231 Encounter for screening mammogram for malignant neoplasm of breast: Secondary | ICD-10-CM

## 2017-08-11 ENCOUNTER — Ambulatory Visit
Admission: RE | Admit: 2017-08-11 | Discharge: 2017-08-11 | Disposition: A | Discharge status: Home / Self Care | Payer: Medicare Other | Source: Ambulatory Visit | Attending: Cardiology | Admitting: Cardiology

## 2017-08-11 ENCOUNTER — Other Ambulatory Visit: Payer: Self-pay | Admitting: Cardiology

## 2017-08-11 DIAGNOSIS — J40 Bronchitis, not specified as acute or chronic: Secondary | ICD-10-CM

## 2017-09-18 ENCOUNTER — Ambulatory Visit
Admission: RE | Admit: 2017-09-18 | Discharge: 2017-09-18 | Disposition: A | Discharge status: Home / Self Care | Payer: Medicare Other | Source: Ambulatory Visit | Attending: Cardiology | Admitting: Cardiology

## 2017-09-18 DIAGNOSIS — Z1231 Encounter for screening mammogram for malignant neoplasm of breast: Secondary | ICD-10-CM

## 2018-03-25 ENCOUNTER — Emergency Department (HOSPITAL_COMMUNITY): Payer: Medicare Other

## 2018-03-25 ENCOUNTER — Observation Stay (HOSPITAL_COMMUNITY)
Admission: EM | Admit: 2018-03-25 | Discharge: 2018-03-27 | Disposition: A | Discharge status: Home / Self Care | Payer: Medicare Other | Attending: Internal Medicine | Admitting: Internal Medicine

## 2018-03-25 ENCOUNTER — Encounter (HOSPITAL_COMMUNITY): Payer: Self-pay | Admitting: Internal Medicine

## 2018-03-25 DIAGNOSIS — E785 Hyperlipidemia, unspecified: Secondary | ICD-10-CM | POA: Diagnosis present

## 2018-03-25 DIAGNOSIS — Z7902 Long term (current) use of antithrombotics/antiplatelets: Secondary | ICD-10-CM | POA: Diagnosis not present

## 2018-03-25 DIAGNOSIS — Z8249 Family history of ischemic heart disease and other diseases of the circulatory system: Secondary | ICD-10-CM | POA: Insufficient documentation

## 2018-03-25 DIAGNOSIS — I2510 Atherosclerotic heart disease of native coronary artery without angina pectoris: Secondary | ICD-10-CM | POA: Diagnosis not present

## 2018-03-25 DIAGNOSIS — I878 Other specified disorders of veins: Secondary | ICD-10-CM | POA: Insufficient documentation

## 2018-03-25 DIAGNOSIS — R072 Precordial pain: Secondary | ICD-10-CM

## 2018-03-25 DIAGNOSIS — M797 Fibromyalgia: Secondary | ICD-10-CM | POA: Diagnosis not present

## 2018-03-25 DIAGNOSIS — E039 Hypothyroidism, unspecified: Secondary | ICD-10-CM | POA: Diagnosis not present

## 2018-03-25 DIAGNOSIS — Z7982 Long term (current) use of aspirin: Secondary | ICD-10-CM | POA: Insufficient documentation

## 2018-03-25 DIAGNOSIS — Z79890 Hormone replacement therapy (postmenopausal): Secondary | ICD-10-CM | POA: Insufficient documentation

## 2018-03-25 DIAGNOSIS — I700 Atherosclerosis of aorta: Secondary | ICD-10-CM | POA: Diagnosis not present

## 2018-03-25 DIAGNOSIS — I10 Essential (primary) hypertension: Secondary | ICD-10-CM | POA: Diagnosis not present

## 2018-03-25 DIAGNOSIS — I4510 Unspecified right bundle-branch block: Secondary | ICD-10-CM | POA: Diagnosis not present

## 2018-03-25 DIAGNOSIS — D638 Anemia in other chronic diseases classified elsewhere: Secondary | ICD-10-CM | POA: Diagnosis not present

## 2018-03-25 DIAGNOSIS — D869 Sarcoidosis, unspecified: Secondary | ICD-10-CM | POA: Insufficient documentation

## 2018-03-25 DIAGNOSIS — Z79899 Other long term (current) drug therapy: Secondary | ICD-10-CM | POA: Insufficient documentation

## 2018-03-25 DIAGNOSIS — R079 Chest pain, unspecified: Secondary | ICD-10-CM | POA: Diagnosis not present

## 2018-03-25 HISTORY — DX: Hypothyroidism, unspecified: E03.9

## 2018-03-25 LAB — I-STAT TROPONIN, ED
Troponin i, poc: 0 ng/mL (ref 0.00–0.08)
Troponin i, poc: 0.02 ng/mL (ref 0.00–0.08)

## 2018-03-25 LAB — CBC
HCT: 38.2 % (ref 36.0–46.0)
Hemoglobin: 11 g/dL — ABNORMAL LOW (ref 12.0–15.0)
MCH: 25.3 pg — ABNORMAL LOW (ref 26.0–34.0)
MCHC: 28.8 g/dL — ABNORMAL LOW (ref 30.0–36.0)
MCV: 88 fL (ref 80.0–100.0)
Platelets: 228 10*3/uL (ref 150–400)
RBC: 4.34 MIL/uL (ref 3.87–5.11)
RDW: 15.4 % (ref 11.5–15.5)
WBC: 8.7 10*3/uL (ref 4.0–10.5)
nRBC: 0 % (ref 0.0–0.2)

## 2018-03-25 LAB — BASIC METABOLIC PANEL
Anion gap: 10 (ref 5–15)
BUN: 14 mg/dL (ref 8–23)
CO2: 26 mmol/L (ref 22–32)
Calcium: 9.6 mg/dL (ref 8.9–10.3)
Chloride: 103 mmol/L (ref 98–111)
Creatinine, Ser: 1.16 mg/dL — ABNORMAL HIGH (ref 0.44–1.00)
GFR calc Af Amer: 53 mL/min — ABNORMAL LOW (ref >60)
GFR calc non Af Amer: 46 mL/min — ABNORMAL LOW (ref >60)
Glucose, Bld: 117 mg/dL — ABNORMAL HIGH (ref 70–99)
Potassium: 3.8 mmol/L (ref 3.5–5.1)
SODIUM: 139 mmol/L (ref 135–145)

## 2018-03-25 LAB — T4, FREE: Free T4: 0.76 ng/dL — ABNORMAL LOW (ref 0.82–1.77)

## 2018-03-25 LAB — TSH: TSH: 1.684 u[IU]/mL (ref 0.350–4.500)

## 2018-03-25 LAB — TROPONIN I: Troponin I: <0.03 ng/mL (ref <0.03)

## 2018-03-25 MED ORDER — CLOPIDOGREL BISULFATE 75 MG PO TABS
75.0000 mg | ORAL_TABLET | Freq: Every day | ORAL | Status: DC
  Administered 2018-03-25 – 2018-03-27 (×3): 75 mg via ORAL
  Filled 2018-03-25 (×3): qty 1

## 2018-03-25 MED ORDER — PANTOPRAZOLE SODIUM 40 MG PO TBEC
40.0000 mg | DELAYED_RELEASE_TABLET | Freq: Every day | ORAL | Status: DC
  Administered 2018-03-25 – 2018-03-27 (×3): 40 mg via ORAL
  Filled 2018-03-25 (×3): qty 1

## 2018-03-25 MED ORDER — HYDROCHLOROTHIAZIDE 25 MG PO TABS
25.0000 mg | ORAL_TABLET | Freq: Every day | ORAL | Status: DC
  Administered 2018-03-25 – 2018-03-27 (×3): 25 mg via ORAL
  Filled 2018-03-25 (×3): qty 1

## 2018-03-25 MED ORDER — SODIUM CHLORIDE 0.9% FLUSH: 3.0000 mL | Freq: Once | INTRAVENOUS | Status: DC

## 2018-03-25 MED ORDER — OLMESARTAN-AMLODIPINE-HCTZ 40-10-25 MG PO TABS: 1.0000 | ORAL_TABLET | Freq: Every day | ORAL | Status: DC

## 2018-03-25 MED ORDER — ROSUVASTATIN CALCIUM 20 MG PO TABS
20.0000 mg | ORAL_TABLET | Freq: Every evening | ORAL | Status: DC
  Administered 2018-03-25 – 2018-03-26 (×2): 20 mg via ORAL
  Filled 2018-03-25 (×4): qty 1

## 2018-03-25 MED ORDER — ENOXAPARIN SODIUM 40 MG/0.4ML ~~LOC~~ SOLN
40.0000 mg | SUBCUTANEOUS | Status: DC
  Administered 2018-03-25 – 2018-03-26 (×2): 40 mg via SUBCUTANEOUS
  Filled 2018-03-25 (×3): qty 0.4

## 2018-03-25 MED ORDER — MORPHINE SULFATE (PF) 2 MG/ML IV SOLN: 2.0000 mg | INTRAVENOUS | Status: DC | PRN

## 2018-03-25 MED ORDER — AMLODIPINE BESYLATE 10 MG PO TABS
10.0000 mg | ORAL_TABLET | Freq: Every day | ORAL | Status: DC
  Administered 2018-03-25 – 2018-03-27 (×3): 10 mg via ORAL
  Filled 2018-03-25: qty 2
  Filled 2018-03-25 (×2): qty 1

## 2018-03-25 MED ORDER — FENTANYL CITRATE (PF) 100 MCG/2ML IJ SOLN
50.0000 ug | Freq: Once | INTRAMUSCULAR | Status: AC
  Administered 2018-03-25: 50 ug via INTRAVENOUS
  Filled 2018-03-25: qty 2

## 2018-03-25 MED ORDER — VITAMIN C 250 MG PO TABS
1000.0000 mg | ORAL_TABLET | Freq: Every day | ORAL | Status: DC
  Administered 2018-03-26 – 2018-03-27 (×2): 1000 mg via ORAL
  Filled 2018-03-25 (×2): qty 4

## 2018-03-25 MED ORDER — IRBESARTAN 300 MG PO TABS
300.0000 mg | ORAL_TABLET | Freq: Every day | ORAL | Status: DC
  Administered 2018-03-26 – 2018-03-27 (×2): 300 mg via ORAL
  Filled 2018-03-25 (×3): qty 1

## 2018-03-25 MED ORDER — ONDANSETRON HCL 4 MG/2ML IJ SOLN: 4.0000 mg | Freq: Four times a day (QID) | INTRAMUSCULAR | Status: DC | PRN

## 2018-03-25 MED ORDER — LEVOTHYROXINE SODIUM 25 MCG PO TABS
25.0000 ug | ORAL_TABLET | Freq: Every day | ORAL | Status: DC
  Administered 2018-03-25 – 2018-03-27 (×3): 25 ug via ORAL
  Filled 2018-03-25 (×3): qty 1

## 2018-03-25 MED ORDER — ACETAMINOPHEN 325 MG PO TABS
650.0000 mg | ORAL_TABLET | ORAL | Status: DC | PRN
  Administered 2018-03-25 – 2018-03-27 (×5): 650 mg via ORAL
  Filled 2018-03-25 (×6): qty 2

## 2018-03-25 MED ORDER — OXYCODONE HCL 5 MG PO TABS: 5.0000 mg | ORAL_TABLET | ORAL | Status: DC | PRN

## 2018-03-25 NOTE — Progress Notes (Signed)
I called Dr. Zenia Resides this office shortly [phone]) after it opened, but the answering service picked up.  I asked that they place a consult request to him and left my cell phone number for him to return the call.  I had not heard from him by about 230PM and so I called back.  He had not received the consult request but had received a call from the patient notifying him of her admission.  During my conversation, I reported her atypical pain with 3 negative troponins.  He requested that I discharge the patient and have her follow up in his office tomorrow.  I went to discuss this with the patient.  She refused to be discharged, reporting that she has already been admitted and wishes to stay overnight.  She also requested that Dr. Terrence Dupont consult while she is in the hospital.  I called back to his office and spoke with him.  He will plan to see the patient here.  If he and the patient decide that she can go home tonight, I will be happy to facilitate her discharge at that time.  Carlyon Shadow, M.D.

## 2018-03-25 NOTE — Progress Notes (Signed)
Pt found in room. Pt admitted from ER. Telebox 12 applied/ccmd notified. Vitals stable. Pt denies chest pain. Will continue to monitor.  Jerald Kief, RN

## 2018-03-25 NOTE — ED Notes (Signed)
Pt c/o left arm pain, sharp in nature for about the last 49mins. Pt rating pain 9/10. No diaphoresis, n/v/sweating/EKG changes. Refused morphine but agreeable to tylenol. Dr. Lorin Mercy aware.

## 2018-03-25 NOTE — ED Triage Notes (Signed)
Pt reports CP onset 1hr PTA. L sided with radiation down L arm. Sharp, 8/10. Reports SOB and lightheadedness.

## 2018-03-25 NOTE — Plan of Care (Signed)
  Problem: Education: Goal: Knowledge of General Education information will improve Description Including pain rating scale, medication(s)/side effects and non-pharmacologic comfort measures Outcome: Progressing   

## 2018-03-25 NOTE — Consult Note (Signed)
Reason for Consult: Chest pain Referring Physician: [facility] hospitalist  MARLICIA SROKA is an 78 y.o. female.  HPI: Patient is 78 year old female with past medical history significant for nonobstructive coronary artery disease, hypertension, hyperlipidemia, hypothyroidism, morbid obesity, osteoarthritis, history of sarcoidosis, history of chronic anemia, came to ER complaining of retrosternal chest pain radiating to left arm described as pressure rated 10/10 associated nausea and mild shortness of breath lasting few minutes.  Patient did not take any sublingual nitro but controlled to ER.  States chest pain was intermittent lasting few minutes denies any exertional chest pain.  EKG done in the ED showed normal sinus rhythm with right bundle branch block no new acute ischemic changes were noted 2 sets of troponin I has been negative.  Denies any recent cardiac work-up.  Past Medical History:  Diagnosis Date  . Fibromyalgia   . Hyperlipidemia   . Hypertension   . Hypothyroidism (acquired)     Past Surgical History:  Procedure Laterality Date  . ABDOMINAL HYSTERECTOMY    . BACK SURGERY    . CARDIAC CATHETERIZATION N/A 03/06/2016   Procedure: Left Heart Cath and Coronary Angiography;  Surgeon: Charolette Forward, MD;  Location: [facility];  Service: Cardiovascular;  Laterality: N/A;  . CARPAL TUNNEL RELEASE    . COLON SURGERY    . THYROIDECTOMY      Family History  Problem Relation Age of Onset  . CAD Son 58    Social History:  reports that she has never smoked. She has never used smokeless tobacco. She reports that she does not drink alcohol or use drugs.  Allergies:  Allergies  Allergen Reactions  . Aspirin Shortness Of Breath and Swelling    Tongue swelling  . Contrast Media [Iodinated Diagnostic Agents] Shortness Of Breath and Swelling    Tongue swelling   . Motrin [Ibuprofen] Shortness Of Breath and Swelling    Tongue swelling     Medications: I have reviewed the  patient's current medications.  Results for orders placed or performed during the hospital encounter of 03/25/18 (from the past 48 hour(s))  Basic metabolic panel     Status: Abnormal   Collection Time: 03/25/18  1:25 AM  Result Value Ref Range   Sodium 139 135 - 145 mmol/L   Potassium 3.8 3.5 - 5.1 mmol/L   Chloride 103 98 - 111 mmol/L   CO2 26 22 - 32 mmol/L   Glucose, Bld 117 (H) 70 - 99 mg/dL   BUN 14 8 - 23 mg/dL   Creatinine, Ser 1.16 (H) 0.44 - 1.00 mg/dL   Calcium 9.6 8.9 - 10.3 mg/dL   GFR calc non Af Amer 46 (L) >60 mL/min   GFR calc Af Amer 53 (L) >60 mL/min   Anion gap 10 5 - 15    Comment: Performed at [facility] [address], [place], [place] [address]  CBC     Status: Abnormal   Collection Time: 03/25/18  1:25 AM  Result Value Ref Range   WBC 8.7 4.0 - 10.5 K/uL   RBC 4.34 3.87 - 5.11 MIL/uL   Hemoglobin 11.0 (L) 12.0 - 15.0 g/dL   HCT 38.2 36.0 - 46.0 %   MCV 88.0 80.0 - 100.0 fL   MCH 25.3 (L) 26.0 - 34.0 pg   MCHC 28.8 (L) 30.0 - 36.0 g/dL   RDW 15.4 11.5 - 15.5 %   Platelets 228 150 - 400 K/uL   nRBC 0.0 0.0 - 0.2 %  Comment: Performed at [facility], [place] [address] [place], [place] [address]  I-Stat Troponin, ED (not at [facility])     Status: None   Collection Time: 03/25/18  1:56 AM  Result Value Ref Range   Troponin i, poc 0.00 0.00 - 0.08 ng/mL   Comment 3            Comment: Due to the release kinetics of cTnI, a negative result within the first hours of the onset of symptoms does not rule out myocardial infarction with certainty. If myocardial infarction is still suspected, repeat the test at appropriate intervals.   I-Stat Troponin, ED (not at [facility])     Status: None   Collection Time: 03/25/18  7:24 AM  Result Value Ref Range   Troponin i, poc 0.02 0.00 - 0.08 ng/mL   Comment 3            Comment: Due to the release kinetics of cTnI, a negative result within the first hours of the onset of symptoms does not rule  out myocardial infarction with certainty. If myocardial infarction is still suspected, repeat the test at appropriate intervals.   Troponin I - Once-Timed     Status: None   Collection Time: 03/25/18  1:00 PM  Result Value Ref Range   Troponin I <0.03 <0.03 ng/mL    Comment: Performed at [facility], [place] [address] [place], [place] [address]    Dg Chest 2 View  Result Date: 03/25/2018 CLINICAL DATA:  Chest pain EXAM: CHEST - 2 VIEW COMPARISON:  08/11/2017 FINDINGS: Cardiac shadows within normal limits. Aortic calcifications are noted. The lungs are well aerated bilaterally. No focal infiltrate or sizable effusion is seen. Mild degenerative changes of the thoracic spine are noted. IMPRESSION: No acute abnormality noted. Electronically Signed   By: Inez Catalina M.D.   On: 03/25/2018 01:45    Review of Systems  Constitutional: Negative for chills, fever and malaise/fatigue.  Eyes: Negative for blurred vision.  Respiratory: Positive for shortness of breath.   Cardiovascular: Positive for chest pain. Negative for orthopnea, claudication and leg swelling.  Gastrointestinal: Positive for nausea. Negative for vomiting.  Genitourinary: Negative for dysuria.  Skin: Negative for rash.  Neurological: Negative for dizziness.   Blood pressure 118/68, pulse (!) 58, temperature 98.7 F (37.1 C), temperature source Oral, resp. rate 19, height 5\' 5"  (1.651 m), weight 99.8 kg, SpO2 98 %. Physical Exam  Constitutional: She is oriented to person, place, and time.  HENT:  Head: Normocephalic and atraumatic.  Eyes: Pupils are equal, round, and reactive to light. Conjunctivae are normal. Left eye exhibits discharge. No scleral icterus.  Neck: Normal range of motion. Neck supple. No JVD present. No tracheal deviation present. No thyromegaly present.  Cardiovascular: Normal rate and regular rhythm.  Murmur (Soft systolic murmur noted no S3 gallop) heard. Respiratory: Effort normal and breath  sounds normal. No respiratory distress. She has no wheezes. She has no rales.  GI: Soft. Bowel sounds are normal. She exhibits no distension. There is no abdominal tenderness. There is no rebound.  Musculoskeletal:        General: No tenderness, deformity or edema.  Neurological: She is alert and oriented to person, place, and time.  Skin: She is not diaphoretic.    Assessment/Plan: Recurrent chest pain with some features worrisome for angina rule out MI History of nonobstructive CAD in the past Hypertension Hyperlipidemia Hypothyroidism Morbid obesity History of sarcoidosis in the past Anemia of chronic  disease Plan Agree with present management We will schedule for Lexiscan Myoview in a.m.  Charolette Forward 03/25/2018, 4:50 PM

## 2018-03-25 NOTE — ED Provider Notes (Signed)
[facility] Provider Note   CSN: [id number] Arrival date & time: 03/25/18  0102  History   Chief Complaint Chief Complaint  Patient presents with  . Chest Pain    HPI Stephanie Mcdaniel is a 78 y.o. female with past medical history significant for hypertension, hyperlipidemia, CAD who presents for evaluation of chest pain. Pain began yesterday at 11pm. Pain substernal with radiation to left arm. Rated her pain a 10/10. Currently a 6/10. Associated nausea, dizziness, headedness, SOB.  Patient states she has had increasing shortness of breath with exertion.  Has had similar episodes over the last month, however has not followed up with cardiology.  Was last seen by cardiologist, Dr. Terrence Dupont in December, however she was not having intermittent chest pain at that time.  Previously heart cath January, 2018 with 20-40% stenosis.  Did not have stents placed.  Denies fever, chills, headache, neck pain, neck stiffness, abdominal pain, diarrhea, dysuria or constipation, leg swelling, palpitations, hx PE, DVT, recent surgery or recent immobilization.  History provided by patient.  No interpreter was used.  HPI  Past Medical History:  Diagnosis Date  . Fibromyalgia   . Hyperlipidemia   . Hypertension     There are no active problems to display for this patient.   Past Surgical History:  Procedure Laterality Date  . ABDOMINAL HYSTERECTOMY    . BACK SURGERY    . CARDIAC CATHETERIZATION N/A 03/06/2016   Procedure: Left Heart Cath and Coronary Angiography;  Surgeon: Charolette Forward, MD;  Location: [facility];  Service: Cardiovascular;  Laterality: N/A;  . CARPAL TUNNEL RELEASE    . COLON SURGERY    . THYROIDECTOMY       OB History   No obstetric history on file.      Home Medications    Prior to Admission medications   Medication Sig Start Date End Date Taking? Authorizing Provider  acetaminophen (TYLENOL) 500 MG tablet Take 1,000 mg by mouth  every 6 (six) hours as needed for moderate pain.    Yes [provider]  Ascorbic Acid (VITAMIN C) 1000 MG tablet Take 1,000 mg by mouth daily.   Yes [provider]  clopidogrel (PLAVIX) 75 MG tablet Take 75 mg by mouth daily.   Yes [provider]  isosorbide mononitrate (IMDUR) 30 MG 24 hr tablet Take 30 mg by mouth daily as needed (blood pressure).  03/03/16  Yes [provider]  levothyroxine (SYNTHROID, LEVOTHROID) 25 MCG tablet Take 25 mcg by mouth daily. 01/27/18  Yes [provider]  nitroGLYCERIN (NITROSTAT) 0.4 MG SL tablet Place 0.4 mg under the tongue every 5 (five) minutes as needed for chest pain.  02/13/16  Yes [provider]  Olmesartan-Amlodipine-HCTZ (TRIBENZOR) 40-10-25 MG TABS Take 1 tablet by mouth daily.   Yes [provider]  pantoprazole (PROTONIX) 40 MG tablet Take 40 mg by mouth daily.   Yes [provider]  rosuvastatin (CRESTOR) 20 MG tablet Take 20 mg by mouth every evening.    Yes [provider]    Family History No family history on file.  Social History Social History   Tobacco Use  . Smoking status: Never Smoker  . Smokeless tobacco: Never Used  Substance Use Topics  . Alcohol use: No  . Drug use: No     Allergies   Aspirin; Contrast media [iodinated diagnostic agents]; and Motrin [ibuprofen]   Review of Systems Review of Systems  Constitutional: Negative.  HENT: Negative.   Respiratory: Positive for chest tightness and shortness of breath.   Cardiovascular: Positive for chest pain.  Gastrointestinal: Positive for nausea. Negative for abdominal distention, abdominal pain, anal bleeding, blood in stool, constipation, diarrhea and vomiting.  Genitourinary: Negative.   Musculoskeletal: Negative.   Skin: Negative.   Neurological: Positive for dizziness and light-headedness.  All other systems reviewed and are negative.    Physical Exam Updated Vital Signs BP  102/87   Pulse (!) 55   Temp 98.9 F (37.2 C) (Oral)   Resp 18   SpO2 98%   Physical Exam Vitals signs and nursing note reviewed.  Constitutional:      General: She is not in acute distress.    Appearance: She is well-developed.  HENT:     Head: Atraumatic.  Eyes:     Pupils: Pupils are equal, round, and reactive to light.     Comments: No horizontal, vertical or rotational nystagmus   Neck:     Musculoskeletal: Normal range of motion.     Comments: Full active and passive ROM without pain No midline or paraspinal tenderness No nuchal rigidity or meningeal signs  Cardiovascular:     Rate and Rhythm: Normal rate.     Heart sounds: Normal heart sounds. No murmur.  Pulmonary:     Effort: No respiratory distress.     Comments: Clear to auscultation bilaterally without wheeze, rhonchi or rales.  No accessory muscle usage. Abdominal:     General: There is no distension.     Comments: Soft, nontender without rebound or guarding.  Musculoskeletal: Normal range of motion.     Comments: Moves all extremities without difficulty.  Ambulatory in department.  No lower extremity swelling or tenderness.  Skin:    General: Skin is warm and dry.     Comments: No edema, erythema, ecchymosis or warmth.  Neurological:     Mental Status: She is alert.     Comments: Mental Status:  Alert, oriented, thought content appropriate. Speech fluent without evidence of aphasia. Able to follow 2 step commands without difficulty.  Cranial Nerves:  II:  Peripheral visual fields grossly normal, pupils equal, round, reactive to light III,IV, VI: ptosis not present, extra-ocular motions intact bilaterally  V,VII: smile symmetric, facial light touch sensation equal VIII: hearing grossly normal bilaterally  IX,X: midline uvula rise  XI: bilateral shoulder shrug equal and strong XII: midline tongue extension  Motor:  5/5 in upper and lower extremities bilaterally including strong and equal grip strength and  dorsiflexion/plantar flexion Sensory: Pinprick and light touch normal in all extremities.  Deep Tendon Reflexes: 2+ and symmetric  Cerebellar: normal finger-to-nose with bilateral upper extremities Gait: normal gait and balance CV: distal pulses palpable throughout      ED Treatments / Results  Labs (all labs ordered are listed, but only abnormal results are displayed) Labs Reviewed  BASIC METABOLIC PANEL - Abnormal; Notable for the following components:      Result Value   Glucose, Bld 117 (*)    Creatinine, Ser 1.16 (*)    GFR calc non Af Amer 46 (*)    GFR calc Af Amer 53 (*)    All other components within normal limits  CBC - Abnormal; Notable for the following components:   Hemoglobin 11.0 (*)    MCH 25.3 (*)    MCHC 28.8 (*)    All other components within normal limits  I-STAT TROPONIN, ED  I-STAT TROPONIN, ED    EKG EKG  Interpretation  Date/Time:  Thursday March 25 2018 01:07:03 EST Ventricular Rate:  72 PR Interval:  200 QRS Duration: 150 QT Interval:  428 QTC Calculation: 468 R Axis:   113 Text Interpretation:  Normal sinus rhythm Right bundle branch block Confirmed by Randal Buba, April ([id number]) on 03/25/2018 2:36:21 AM   Radiology Dg Chest 2 View  Result Date: 03/25/2018 CLINICAL DATA:  Chest pain EXAM: CHEST - 2 VIEW COMPARISON:  08/11/2017 FINDINGS: Cardiac shadows within normal limits. Aortic calcifications are noted. The lungs are well aerated bilaterally. No focal infiltrate or sizable effusion is seen. Mild degenerative changes of the thoracic spine are noted. IMPRESSION: No acute abnormality noted. Electronically Signed   By: Inez Catalina M.D.   On: 03/25/2018 01:45    Procedures Procedures (including critical care time)  Medications Ordered in ED Medications  sodium chloride flush (NS) 0.9 % injection 3 mL (3 mLs Intravenous Not Given 03/25/18 0735)  fentaNYL (SUBLIMAZE) injection 50 mcg (has no administration in time range)   Initial Impression /  Assessment and Plan / ED Course  I have reviewed the triage vital signs and the nursing notes.  Pertinent labs & imaging results that were available during my care of the patient were reviewed by me and considered in my medical decision making (see chart for details).  78 year old female who appears otherwise well presents for evaluation of chest pain.  Afebrile, nonseptic, non-ill-appearing.  Had increased episodes of chest pain over the last month.  Followed by cardiology.  Has not followed up for recurrent episodes.  Episodes exertional in nature.  Had one episode which started 11 PM this evening which had associated lightheadedness, dizziness, nausea, substernal chest pain which radiated to her left arm. Left heart cath 02/2016 showed stenosis 20-40%, no stent placement. Heart score 5.  No lower extremity edema, no history DVT, PE no hemoptysis, no tachycardia or tachypnea.  Low suspicion for PE or dissection causing patient's symptoms.  CBC without leukocytosis, BMP with mildly elevated creatinine 1.16 improved from previous, initial troponin negative, chest x-ray negative for infiltrates, pulmonary edema, cardiomegaly, pneumothorax, EKG with right bundle branch, similar to previous EKG. No focal neuro exam without deficits, low suspicion for CVA causing patients dizziness.  Concern for cardiac etiology of Chest Pain. Pt has been re-evaluated prior to consult and VSS, NAD, heart RRR, lungs CTAB.   0730: Consulted with Dr. Lorin Mercy who agrees for [place]  Patient seen and evaluated by my attending who agrees with above treatment, plan and disposition. Final Clinical Impressions(s) / ED Diagnoses   Final diagnoses:  Chest pain, unspecified type    ED Discharge Orders    None       Henderly, Britni A, PA-C 03/25/18 0749    Palumbo, April, MD 03/25/18 2311

## 2018-03-25 NOTE — H&P (Signed)
History and Physical    Stephanie Mcdaniel UUV:[id number] DOB: 04/15/1940 DOA: 03/25/2018  PCP: Charolette Forward, MD Consultants:  None Patient coming from:  Home - lives alone; NOK: Son and granddaughter, Molli Hazard, [phone]  Chief Complaint: chest pain  HPI: Stephanie Mcdaniel is a 78 y.o. female with medical history significant of HTN; HLD; and fibromyalgia presenting with chest pain.  She was watching TV last night, relaxed and reclined, when she got a sharp pain in her chest.  She tried to move around and eased off her shoulder and then the pain radiated down her arm.  She got up and walked around and the pain was still there.  She sat back down and the pain became sharp and she got dizzy.  The pain didn't resolve and so she came to the ER.  The pain in her chest is now gone but her arm is still hurting and her fingers are numb and tingling.  She has not had pain recently; she last had chest pain about 2 weeks ago and she took NTG.  She did not take NTG last night.  She had some SOB yesterday and dizziness while at rest; that resolved and was separate from the chest pain.   ED Course:  Chest pain - substernal with radiation to left shoulder.  Pain improved.  Has h/o known CAD - 20-40% stenosis, no stent.  Last saw Dr. Terrence Dupont in December.  +exertional SOB and CP.  Troponin negative.  EKG with chronic RBBB.  Review of Systems: As per HPI; otherwise review of systems reviewed and negative.   Ambulatory Status:  Ambulates without assistance  Past Medical History:  Diagnosis Date  . Fibromyalgia   . Hyperlipidemia   . Hypertension   . Hypothyroidism (acquired)     Past Surgical History:  Procedure Laterality Date  . ABDOMINAL HYSTERECTOMY    . BACK SURGERY    . CARDIAC CATHETERIZATION N/A 03/06/2016   Procedure: Left Heart Cath and Coronary Angiography;  Surgeon: Charolette Forward, MD;  Location: [facility];  Service: Cardiovascular;  Laterality: N/A;  . CARPAL TUNNEL RELEASE      . COLON SURGERY    . THYROIDECTOMY      Social History   Socioeconomic History  . Marital status: Legally Separated    Spouse name: Not on file  . Number of children: Not on file  . Years of education: Not on file  . Highest education level: Not on file  Occupational History  . Occupation: retired  Scientific laboratory technician  . Financial resource strain: Not on file  . Food insecurity:    Worry: Not on file    Inability: Not on file  . Transportation needs:    Medical: Not on file    Non-medical: Not on file  Tobacco Use  . Smoking status: Never Smoker  . Smokeless tobacco: Never Used  Substance and Sexual Activity  . Alcohol use: No  . Drug use: No  . Sexual activity: Not on file  Lifestyle  . Physical activity:    Days per week: Not on file    Minutes per session: Not on file  . Stress: Not on file  Relationships  . Social connections:    Talks on phone: Not on file    Gets together: Not on file    Attends religious service: Not on file    Active member of club or organization: Not on file    Attends meetings of clubs or organizations:  Not on file    Relationship status: Not on file  . Intimate partner violence:    Fear of current or ex partner: Not on file    Emotionally abused: Not on file    Physically abused: Not on file    Forced sexual activity: Not on file  Other Topics Concern  . Not on file  Social History Narrative  . Not on file    Allergies  Allergen Reactions  . Aspirin Shortness Of Breath and Swelling    Tongue swelling  . Contrast Media [Iodinated Diagnostic Agents] Shortness Of Breath and Swelling    Tongue swelling   . Motrin [Ibuprofen] Shortness Of Breath and Swelling    Tongue swelling     Family History  Problem Relation Age of Onset  . CAD Son 82    Prior to Admission medications   Medication Sig Start Date End Date Taking? Authorizing Provider  acetaminophen (TYLENOL) 500 MG tablet Take 1,000 mg by mouth every 6 (six) hours as  needed for moderate pain.    Yes [provider]  Ascorbic Acid (VITAMIN C) 1000 MG tablet Take 1,000 mg by mouth daily.   Yes [provider]  clopidogrel (PLAVIX) 75 MG tablet Take 75 mg by mouth daily.   Yes [provider]  isosorbide mononitrate (IMDUR) 30 MG 24 hr tablet Take 30 mg by mouth daily as needed (blood pressure).  03/03/16  Yes [provider]  levothyroxine (SYNTHROID, LEVOTHROID) 25 MCG tablet Take 25 mcg by mouth daily. 01/27/18  Yes [provider]  nitroGLYCERIN (NITROSTAT) 0.4 MG SL tablet Place 0.4 mg under the tongue every 5 (five) minutes as needed for chest pain.  02/13/16  Yes [provider]  Olmesartan-Amlodipine-HCTZ (TRIBENZOR) 40-10-25 MG TABS Take 1 tablet by mouth daily.   Yes [provider]  pantoprazole (PROTONIX) 40 MG tablet Take 40 mg by mouth daily.   Yes [provider]  rosuvastatin (CRESTOR) 20 MG tablet Take 20 mg by mouth every evening.    Yes [provider]    Physical Exam: Vitals:   03/25/18 0110 03/25/18 0358 03/25/18 0854 03/25/18 0930  BP: (!) 150/71 102/87  114/75  Pulse: 73 (!) 55  (!) 53  Resp: 18 18  12   Temp: 98.9 F (37.2 C)     TempSrc: Oral     SpO2: 98% 98%  98%  Weight:   99.8 kg   Height:   5\' 5"  (1.651 m)      . General:  Appears calm and comfortable and is NAD . Eyes:  PERRL, EOMI, normal lids, iris . ENT:  grossly normal hearing, lips & tongue, mmm . Neck:  no LAD, masses or thyromegaly; no carotid bruits . Cardiovascular:  RRR, no m/r/g. No LE edema.  Marland Kitchen Respiratory:   CTA bilaterally with no wheezes/rales/rhonchi.  Normal respiratory effort. . Abdomen:  soft, NT, ND, NABS . Skin:  no rash or induration seen on limited exam . Musculoskeletal:  grossly normal tone BUE/BLE, good ROM, no bony abnormality . Psychiatric:  grossly normal mood and affect, speech fluent and appropriate, AOx3 . Neurologic:  CN 2-12 grossly intact, moves all  extremities in coordinated fashion, sensation intact    Radiological Exams on Admission: Dg Chest 2 View  Result Date: 03/25/2018 CLINICAL DATA:  Chest pain EXAM: CHEST - 2 VIEW COMPARISON:  08/11/2017 FINDINGS: Cardiac shadows within normal limits. Aortic calcifications are noted. The lungs are well aerated bilaterally. No focal infiltrate  or sizable effusion is seen. Mild degenerative changes of the thoracic spine are noted. IMPRESSION: No acute abnormality noted. Electronically Signed   By: Inez Catalina M.D.   On: 03/25/2018 01:45    EKG: Independently reviewed.  NSR with rate 72; RBBB with no evidence of acute ischemia   Labs on Admission: I have personally reviewed the available labs and imaging studies at the time of the admission.  Pertinent labs:   Glucose 117 BUN 14/Creatinine 1.16/GFR 53 - stable Troponin 0.00 WBC 8.7 Hgb 11.0 - stable   Assessment/Plan Principal Problem:   Chest pain Active Problems:   Hypothyroidism (acquired)   Hypertension   Hyperlipidemia   Chest pain -Patient with substernal chest pressure that came on at rest with some radiation to the left arm; resolved spontaneously. -1/3 typical symptoms suggestive of noncardiac chest pain.  -CXR unremarkable.   -Initial cardiac troponin negative.  -EKG not indicative of acute ischemia.  -h/o mild multivessel disease on cath in 1/18  -HEAR score is 5 with a risk of MACE within 30 days >1%.  -Will plan to place in observation status on telemetry to rule out ACS by overnight observation.  -cycle troponin q6h x 3 (already negative x 2) and repeat EKG in AM -Start ASA 81 mg  Daily, continue Plavix -She was previously prescribed Imdur for prn use for "blood pressure" - will hold for now. -morphine given -Risk factor stratification with HgbA1c and FLP; will also check TSH  -Cardiology consultation by Dr. Terrence Dupont - NPO for possible stress test  -Will plan to start Heparin drip if enzymes are positive and/or  chest pain recurs  HTN -Takes Tribenzor at home, will continue -No Imdur for now, as above  HLD -Continue Crestor -Check lipids  Hyperglycemia -Will check A1c -There is no indication to start medication at this time, but will monitor  Hypothyroidism -Check TSH and free T4 -Continue Synthroid at current dose for now    DVT prophylaxis: Lovenox  Code Status:  Full - confirmed with patient Family Communication: None present Disposition Plan:  Home once clinically improved Consults called: Cardiology Admission status: It is my clinical opinion that referral for OBSERVATION is reasonable and necessary in this patient based on the above information provided. The aforementioned taken together are felt to place the patient at high risk for further clinical deterioration. However it is anticipated that the patient may be medically stable for discharge from the hospital within 24 to 48 hours.    Karmen Bongo MD [facility]   How to contact the [facility] Attending or Consulting provider [place] or covering provider during after hours [place], for this patient?  1. Check the care team in [facility] and look for a) attending/consulting [facility] provider listed and b) the [facility] team listed 2. Log into [email or web address] and use [facility]'s universal password to access. If you do not have the password, please contact the hospital operator. 3. Locate the [facility] provider you are looking for under [facility] and page to a number that you can be directly reached. 4. If you still have difficulty reaching the provider, please page the [facility] (Director on Call) for the Hospitalists listed on amion for assistance.   03/25/2018, 1:45 PM

## 2018-03-25 NOTE — ED Notes (Signed)
Called for room X2, no response.

## 2018-03-26 ENCOUNTER — Observation Stay (HOSPITAL_COMMUNITY): Payer: Medicare Other

## 2018-03-26 ENCOUNTER — Encounter (HOSPITAL_COMMUNITY): Payer: Self-pay | Admitting: Physician Assistant

## 2018-03-26 ENCOUNTER — Other Ambulatory Visit: Payer: Self-pay

## 2018-03-26 DIAGNOSIS — E039 Hypothyroidism, unspecified: Secondary | ICD-10-CM | POA: Diagnosis not present

## 2018-03-26 DIAGNOSIS — I10 Essential (primary) hypertension: Secondary | ICD-10-CM | POA: Diagnosis not present

## 2018-03-26 DIAGNOSIS — R079 Chest pain, unspecified: Secondary | ICD-10-CM | POA: Diagnosis not present

## 2018-03-26 DIAGNOSIS — E785 Hyperlipidemia, unspecified: Secondary | ICD-10-CM | POA: Diagnosis not present

## 2018-03-26 DIAGNOSIS — R072 Precordial pain: Secondary | ICD-10-CM | POA: Diagnosis not present

## 2018-03-26 HISTORY — PX: IR US GUIDE VASC ACCESS RIGHT: IMG2390

## 2018-03-26 HISTORY — PX: IR RADIOLOGY PERIPHERAL GUIDED IV START: IMG5598

## 2018-03-26 LAB — LIPID PANEL
Cholesterol: 142 mg/dL (ref 0–200)
HDL: 41 mg/dL (ref >40)
LDL Cholesterol: 85 mg/dL (ref 0–99)
Total CHOL/HDL Ratio: 3.5 RATIO
Triglycerides: 80 mg/dL (ref <150)
VLDL: 16 mg/dL (ref 0–40)

## 2018-03-26 LAB — HEMOGLOBIN A1C
HEMOGLOBIN A1C: 5.9 % — AB (ref 4.8–5.6)
Mean Plasma Glucose: 123 mg/dL

## 2018-03-26 MED ORDER — LIDOCAINE HCL 1 % IJ SOLN
INTRAMUSCULAR | Status: AC
  Filled 2018-03-26: qty 20

## 2018-03-26 MED ORDER — TECHNETIUM TC 99M TETROFOSMIN IV KIT
10.0000 | PACK | Freq: Once | INTRAVENOUS | Status: AC | PRN
  Administered 2018-03-26: 10 via INTRAVENOUS

## 2018-03-26 NOTE — Procedures (Signed)
PROCEDURE SUMMARY:  Successful mage-guided placement of right basilic vein micropuncture sheath for temporary venous access. No complications. EBL = <1 mL Ready for use.  Please see imaging section of Epic for full dictation.  Joaquim Nam PA-C 03/26/2018 3:42 PM

## 2018-03-26 NOTE — Progress Notes (Signed)
Called Dr. Terrence Dupont concerning pt no being able has stress test. No IV access at this time. Dr. Terrence Dupont will come and speak to patient. Pt resting with call bell within reach.  Will continue to monitor. Payton Emerald, RN

## 2018-03-26 NOTE — Progress Notes (Signed)
Subjective:  Presently denies any chest pain states had chest tightness while in the nuclear medicine department.  Could not get IV access for [facility].  Will reschedule for tomorrow and arrange for midline/PICC line.  Objective:  Vital Signs in the last 24 hours: Temp:  [98 F (36.7 C)-98.7 F (37.1 C)] 98 F (36.7 C) (02/14 0343) Pulse Rate:  [52-63] 60 (02/13 1952) Resp:  [15-27] 16 (02/14 0343) BP: (103-141)/(54-126) 110/58 (02/14 0343) SpO2:  [96 %-99 %] 96 % (02/14 0343)  Intake/Output from previous day: 02/13 0701 - 02/14 0700 In: 200 [P.O.:200] Out: -  Intake/Output from this shift: No intake/output data recorded.  Physical Exam: Exam unchanged  Lab Results: Recent Labs    03/25/18 0125  WBC 8.7  HGB 11.0*  PLT 228   Recent Labs    03/25/18 0125  NA 139  K 3.8  CL 103  CO2 26  GLUCOSE 117*  BUN 14  CREATININE 1.16*   Recent Labs    03/25/18 1300  TROPONINI <0.03   Hepatic Function Panel No results for input(s): PROT, ALBUMIN, AST, ALT, ALKPHOS, BILITOT, BILIDIR, IBILI in the last 72 hours. Recent Labs    03/26/18 0410  CHOL 142   No results for input(s): PROTIME in the last 72 hours.  Imaging: Imaging results have been reviewed and Dg Chest 2 View  Result Date: 03/25/2018 CLINICAL DATA:  Chest pain EXAM: CHEST - 2 VIEW COMPARISON:  08/11/2017 FINDINGS: Cardiac shadows within normal limits. Aortic calcifications are noted. The lungs are well aerated bilaterally. No focal infiltrate or sizable effusion is seen. Mild degenerative changes of the thoracic spine are noted. IMPRESSION: No acute abnormality noted. Electronically Signed   By: Inez Catalina M.D.   On: 03/25/2018 01:45    Cardiac Studies:  Assessment/Plan:  Recurrent chest pain with some features worrisome for angina rule out MI History of nonobstructive CAD in the past Hypertension Hyperlipidemia Hypothyroidism Morbid obesity History of sarcoidosis in the past Anemia of  chronic disease Plan Continue present management Please reschedule for Lexiscan Myoview in a.m.  LOS: 0 days    Charolette Forward 03/26/2018, 11:55 AM

## 2018-03-26 NOTE — Progress Notes (Signed)
[facility] PROGRESS NOTE  Stephanie Mcdaniel TDH:[id number] DOB: 12/14/1940 DOA: 03/25/2018 PCP: Charolette Forward, MD  Assessment/Plan:  Chest pain -Patient with substernal chest pressure at rest with some radiation to the left arm; resolved spontaneously.1/3 typical symptoms suggestive of noncardiac chest pain. CXR unremarkable. cardiac troponin negative x3. EKG not indicative of acute ischemia. h/o mild multivessel disease on cath in 1/18. continue ASA 81 mg  Daily, continue Plavix. Scheduled for stress test today but unable to get IV. Needed to go to IR for acess.  -stress test tomorrow  HTN. controlled -Takes Tribenzor at home, will continue -No Imdur for now, as above  HLD -Continue Crestor   Hyperglycemia -A1c 5.9 -There is no indication to start medication at this time, but will monitor  Hypothyroidism -TSH 1.6,free T4 0.76 -Continue Synthroid at current dose for now    Code Status: full Family Communication: grandaughter at bedside Disposition Plan: home hopefully tomorrow   Consultants:  harwani cardiology  Procedures:  IR guided IV start  Antibiotics:    HPI/Subjective: Admitted for chest pain rule out. Cards recommended stress test. Will do tomorrow. Pain free today  Objective: Vitals:   03/25/18 1952 03/26/18 0343  BP: 126/76 (!) 110/58  Pulse: 60   Resp: 15 16  Temp: 98.5 F (36.9 C) 98 F (36.7 C)  SpO2: 99% 96%    Intake/Output Summary (Last 24 hours) at 03/26/2018 1549 Last data filed at 03/25/2018 1955 Gross per 24 hour  Intake 200 ml  Output -  Net 200 ml   Filed Weights   03/25/18 0854  Weight: 99.8 kg    Exam:   General:  Alert no acute distress  Cardiovascular: rrr no mgr trace LE edema  Respiratory: normal effort BS clear bilaterally no wheeze  Abdomen: obese soft +BS no guarding or rebounding  Musculoskeletal: joints without swelling/erythema   Data Reviewed: Basic Metabolic Panel: Recent Labs  Lab  03/25/18 0125  NA 139  K 3.8  CL 103  CO2 26  GLUCOSE 117*  BUN 14  CREATININE 1.16*  CALCIUM 9.6   Liver Function Tests: No results for input(s): AST, ALT, ALKPHOS, BILITOT, PROT, ALBUMIN in the last 168 hours. No results for input(s): LIPASE, AMYLASE in the last 168 hours. No results for input(s): AMMONIA in the last 168 hours. CBC: Recent Labs  Lab 03/25/18 0125  WBC 8.7  HGB 11.0*  HCT 38.2  MCV 88.0  PLT 228   Cardiac Enzymes: Recent Labs  Lab 03/25/18 1300  TROPONINI <0.03   BNP (last 3 results) No results for input(s): BNP in the last 8760 hours.  ProBNP (last 3 results) No results for input(s): PROBNP in the last 8760 hours.  CBG: No results for input(s): GLUCAP in the last 168 hours.  No results found for this or any previous visit (from the past 240 hour(s)).   Studies: Dg Chest 2 View  Result Date: 03/25/2018 CLINICAL DATA:  Chest pain EXAM: CHEST - 2 VIEW COMPARISON:  08/11/2017 FINDINGS: Cardiac shadows within normal limits. Aortic calcifications are noted. The lungs are well aerated bilaterally. No focal infiltrate or sizable effusion is seen. Mild degenerative changes of the thoracic spine are noted. IMPRESSION: No acute abnormality noted. Electronically Signed   By: Inez Catalina M.D.   On: 03/25/2018 01:45    Scheduled Meds: . amLODipine  10 mg Oral Daily  . clopidogrel  75 mg Oral Daily  . enoxaparin (LOVENOX) injection  40 mg Subcutaneous Q24H  . hydrochlorothiazide  25 mg Oral Daily  . irbesartan  300 mg Oral Daily  . levothyroxine  25 mcg Oral Daily  . lidocaine      . pantoprazole  40 mg Oral Daily  . rosuvastatin  20 mg Oral QPM  . sodium chloride flush  3 mL Intravenous Once  . vitamin C  1,000 mg Oral Daily   Continuous Infusions:  Principal Problem:   Chest pain Active Problems:   Hypothyroidism (acquired)   Hypertension   Hyperlipidemia    Time spent: 45 minutes    [place] NP  [facility]  If 7PM-7AM,  please contact night-coverage at [email or web address], password [facility] 03/26/2018, 3:49 PM  LOS: 0 days

## 2018-03-27 ENCOUNTER — Observation Stay (HOSPITAL_COMMUNITY): Payer: Medicare Other

## 2018-03-27 DIAGNOSIS — R072 Precordial pain: Secondary | ICD-10-CM | POA: Diagnosis not present

## 2018-03-27 MED ORDER — REGADENOSON 0.4 MG/5ML IV SOLN
INTRAVENOUS | Status: AC
  Filled 2018-03-27: qty 5

## 2018-03-27 MED ORDER — TECHNETIUM TC 99M TETROFOSMIN IV KIT
30.0000 | PACK | Freq: Once | INTRAVENOUS | Status: AC | PRN
  Administered 2018-03-27: 30 via INTRAVENOUS

## 2018-03-27 MED ORDER — REGADENOSON 0.4 MG/5ML IV SOLN
0.4000 mg | Freq: Once | INTRAVENOUS | Status: AC
  Administered 2018-03-27: 0.4 mg via INTRAVENOUS

## 2018-03-27 MED ORDER — TECHNETIUM TC 99M TETROFOSMIN IV KIT
10.0000 | PACK | Freq: Once | INTRAVENOUS | Status: AC | PRN
  Administered 2018-03-27: 10 via INTRAVENOUS

## 2018-03-27 NOTE — Progress Notes (Signed)
Subjective:  Patient seen in nuclear medicine department denies any chest pain or shortness of breath.  Tolerated stress portion of the Lexiscan Myoview, no new ischemic changes noted during the stress test Myoview result is pending  Objective:  Vital Signs in the last 24 hours: Temp:  [97.9 F (36.6 C)-98.7 F (37.1 C)] 97.9 F (36.6 C) (02/15 0527) Pulse Rate:  [48-61] 60 (02/15 0953) Resp:  [15-19] 15 (02/15 0527) BP: (103-128)/(60-75) 113/66 (02/15 0954) SpO2:  [97 %-100 %] 97 % (02/15 0527)  Intake/Output from previous day: No intake/output data recorded. Intake/Output from this shift: No intake/output data recorded.  Physical Exam: Exam unchanged  Lab Results: Recent Labs    03/25/18 0125  WBC 8.7  HGB 11.0*  PLT 228   Recent Labs    03/25/18 0125  NA 139  K 3.8  CL 103  CO2 26  GLUCOSE 117*  BUN 14  CREATININE 1.16*   Recent Labs    03/25/18 1300  TROPONINI <0.03   Hepatic Function Panel No results for input(s): PROT, ALBUMIN, AST, ALT, ALKPHOS, BILITOT, BILIDIR, IBILI in the last 72 hours. Recent Labs    03/26/18 0410  CHOL 142   No results for input(s): PROTIME in the last 72 hours.  Imaging: Imaging results have been reviewed and Ir US Guide Vasc Access Right  Result Date: 03/26/2018 CLINICAL DATA:  Poor venous access. Request for IV start for stress test. EXAM: ULTRASOUND GUIDED VENIPUNCTURE BY MD CONTRAST:  None. COMPLICATIONS: None immediate TECHNIQUE: The right upper arm was prepped with chlorhexidine in a sterile fashion, and a sterile drape was applied covering the operative field. Local anesthesia was provided with 1% lidocaine. Under direct ultrasound guidance, the right basilic vein was accessed with a micropuncture kit after the overlying soft tissues were anesthetized with 1% lidocaine. An ultrasound image was saved for documentation purposes. The micropuncture sheath easily aspirated and flushed and was secured in place. A dressing was  placed. The patient tolerated the procedure well without immediate post procedural complication. IMPRESSION: Successful ultrasound guided placement of a right basilic vein approach micropuncture sheath for temporary venous access. Read by Candiss Norse, PA-C Electronically Signed   By: Corrie Mckusick D.O.   On: 03/26/2018 15:43   Ir Radiology Peripheral Guided Iv Start  Result Date: 03/26/2018 CLINICAL DATA:  Poor venous access. Request for IV start for stress test. EXAM: ULTRASOUND GUIDED VENIPUNCTURE BY MD CONTRAST:  None. COMPLICATIONS: None immediate TECHNIQUE: The right upper arm was prepped with chlorhexidine in a sterile fashion, and a sterile drape was applied covering the operative field. Local anesthesia was provided with 1% lidocaine. Under direct ultrasound guidance, the right basilic vein was accessed with a micropuncture kit after the overlying soft tissues were anesthetized with 1% lidocaine. An ultrasound image was saved for documentation purposes. The micropuncture sheath easily aspirated and flushed and was secured in place. A dressing was placed. The patient tolerated the procedure well without immediate post procedural complication. IMPRESSION: Successful ultrasound guided placement of a right basilic vein approach micropuncture sheath for temporary venous access. Read by Candiss Norse, PA-C Electronically Signed   By: Corrie Mckusick D.O.   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 Follow-up with me as scheduled  LOS: 0 days    Charolette Forward 03/27/2018, 10:01 AM

## 2018-03-27 NOTE — Discharge Summary (Addendum)
Physician Discharge Summary  Stephanie Mcdaniel ZOX:[id number] DOB: [birth year] DOA: 03/25/2018  PCP: Charolette Forward, MD  Admit date: 03/25/2018 Discharge date: 03/27/2018  Time spent: 45 minutes  Recommendations for Outpatient Follow-up:  1. Follow up with Dr Terrence Dupont as scheduled  2. Further work up of fatigue as an outpatient TSH normal but free t4 still low   Discharge Diagnoses:  Principal Problem:   Chest pain Active Problems:   Hypothyroidism (acquired)   Hypertension   Hyperlipidemia   Discharge Condition: stable  Diet recommendation: heart healthy carb modified  Filed Weights   03/25/18 0854  Weight: 99.8 kg    History of present illness:  Stephanie Mcdaniel is a 78 y.o. female with medical history significant of HTN; HLD; and fibromyalgia presented 03/25/18 with chest pain.  She was watching TV  Night prior, relaxed and reclined, when she got a sharp pain in her chest.  She tried to move around and eased off her shoulder and then the pain radiated down her arm.  She got up and walked around and the pain was still there.  She sat back down and the pain became sharp and she got dizzy.  The pain didn't resolve and so she came to the ER.  The pain in her chest resolved but her arm continued hurting and her fingers were numb and tingling.  She had not had pain recently; she last had chest pain about 2 weeks prior and she took NTG.  She did not take NTG with this episode.  She had some SOB SOB and dizziness while at rest; that resolved and was separate from the chest pain  Hospital Course:  Chest pain -Patient with substernal chest pressure at rest with some radiation to the left arm; resolved spontaneously.1/3 typical symptoms suggestive of noncardiac chest pain. CXR unremarkable. cardiac troponin negative x3. EKG not indicative of acute ischemia. Stress test reveals a small mild reversible defect in the anterior wall towards the apex. No other perfusion abnormalities. Normal left  ventricular wall motion. . Left ventricular ejection fraction 72% Non invasive risk stratification*: Lowh/o mild multivessel disease on cath in 1/18. continue ASA 81 mg Daily, continue Plavix. Follow up with Dr Terrence Dupont as scheduled   HTN. Controlled. Resume home meds at discharge  HLD -ContinueCrestor   Hyperglycemia -A1c 5.9 -There is no indication to start medication at this time, but willmonitor  Hypothyroidism -TSH 1.6,free T4 0.76 -Continue Synthroid at current dose for now  Procedures: Stress test revealing Consultations:  harwani cards  Discharge Exam: Vitals:   03/27/18 0954 03/27/18 1111  BP: 113/66 118/71  Pulse:    Resp:    Temp:    SpO2:      General: awake alert oriented Cardiovascular: rrr no mgr no LE edema Respiratory: normal effort BS clear bilaterally no crackles or wheezes  Discharge Instructions   Discharge Instructions    Diet - low sodium heart healthy   Complete by:  As directed    Discharge instructions   Complete by:  As directed    Take medications as instructed.  Follow up with cardiology as scheduled   Increase activity slowly   Complete by:  As directed      Allergies as of 03/27/2018      Reactions   Aspirin Shortness Of Breath, Swelling   Tongue swelling   Contrast Media [iodinated Diagnostic Agents] Shortness Of Breath, Swelling   Tongue swelling   Motrin [ibuprofen] Shortness Of Breath, Swelling   Tongue swelling  Medication List    TAKE these medications   acetaminophen 500 MG tablet Commonly known as:  TYLENOL Take 1,000 mg by mouth every 6 (six) hours as needed for moderate pain.   clopidogrel 75 MG tablet Commonly known as:  PLAVIX Take 75 mg by mouth daily.   isosorbide mononitrate 30 MG 24 hr tablet Commonly known as:  IMDUR Take 30 mg by mouth daily as needed (blood pressure).   levothyroxine 25 MCG tablet Commonly known as:  SYNTHROID, LEVOTHROID Take 25 mcg by mouth daily.    nitroGLYCERIN 0.4 MG SL tablet Commonly known as:  NITROSTAT Place 0.4 mg under the tongue every 5 (five) minutes as needed for chest pain.   pantoprazole 40 MG tablet Commonly known as:  PROTONIX Take 40 mg by mouth daily.   rosuvastatin 20 MG tablet Commonly known as:  CRESTOR Take 20 mg by mouth every evening.   TRIBENZOR 40-10-25 MG Tabs Generic drug:  Olmesartan-amLODIPine-HCTZ Take 1 tablet by mouth daily.   vitamin C 1000 MG tablet Take 1,000 mg by mouth daily.      Allergies  Allergen Reactions  . Aspirin Shortness Of Breath and Swelling    Tongue swelling  . Contrast Media [Iodinated Diagnostic Agents] Shortness Of Breath and Swelling    Tongue swelling   . Motrin [Ibuprofen] Shortness Of Breath and Swelling    Tongue swelling       The results of significant diagnostics from this hospitalization (including imaging, microbiology, ancillary and laboratory) are listed below for reference.    Significant Diagnostic Studies: Dg Chest 2 View  Result Date: 03/25/2018 CLINICAL DATA:  Chest pain EXAM: CHEST - 2 VIEW COMPARISON:  08/11/2017 FINDINGS: Cardiac shadows within normal limits. Aortic calcifications are noted. The lungs are well aerated bilaterally. No focal infiltrate or sizable effusion is seen. Mild degenerative changes of the thoracic spine are noted. IMPRESSION: No acute abnormality noted. Electronically Signed   By: Inez Catalina M.D.   On: 03/25/2018 01:45   Nm Myocar Multi W/spect W/wall Motion / Ef  Result Date: 03/27/2018 CLINICAL DATA:  Chest pain EXAM: MYOCARDIAL IMAGING WITH SPECT (REST AND PHARMACOLOGIC-STRESS) GATED LEFT VENTRICULAR WALL MOTION STUDY LEFT VENTRICULAR EJECTION FRACTION TECHNIQUE: Standard myocardial SPECT imaging was performed after resting intravenous injection of 10 mCi Tc-23mtetrofosmin. Subsequently, intravenous infusion of Lexiscan was performed under the supervision of the Cardiology staff. At peak effect of the drug, 30 mCi  Tc-939metrofosmin was injected intravenously and standard myocardial SPECT imaging was performed. Quantitative gated imaging was also performed to evaluate left ventricular wall motion, and estimate left ventricular ejection fraction. COMPARISON:  None. FINDINGS: Perfusion: There is a small mild reversible defect in the anterior wall towards the apex. No other perfusion abnormalities noted. Wall Motion: Normal left ventricular wall motion. No left ventricular dilation. Left Ventricular Ejection Fraction: 72 % End diastolic volume 87 ml End systolic volume 25 ml IMPRESSION: 1. There is a small mild reversible defect in the anterior wall towards the apex. No other perfusion abnormalities. 2. Normal left ventricular wall motion. 3. Left ventricular ejection fraction 72% 4. Non invasive risk stratification*: Low *2012 Appropriate Use Criteria for Coronary Revascularization Focused Update: J Am Coll Cardiol. 208916;94(5):[email or web address]?[email or web address] Electronically Signed   By: DaDorise BullionII M.D   On: 03/27/2018 11:18   Ir UsKoreauide Vasc Access Right  Result Date: 03/26/2018 CLINICAL DATA:  Poor venous access. Request for IV start for stress test. EXAM: ULTRASOUND GUIDED VENIPUNCTURE BY MD CONTRAST:  None. COMPLICATIONS: None immediate TECHNIQUE: The right upper arm was prepped with chlorhexidine in a sterile fashion, and a sterile drape was applied covering the operative field. Local anesthesia was provided with 1% lidocaine. Under direct ultrasound guidance, the right basilic vein was accessed with a micropuncture kit after the overlying soft tissues were anesthetized with 1% lidocaine. An ultrasound image was saved for documentation purposes. The micropuncture sheath easily aspirated and flushed and was secured in place. A dressing was placed. The patient tolerated the procedure well without immediate post procedural complication. IMPRESSION: Successful ultrasound guided  placement of a right basilic vein approach micropuncture sheath for temporary venous access. Read by Candiss Norse, PA-C Electronically Signed   By: Corrie Mckusick D.O.   On: 03/26/2018 15:43   Ir Radiology Peripheral Guided Iv Start  Result Date: 03/26/2018 CLINICAL DATA:  Poor venous access. Request for IV start for stress test. EXAM: ULTRASOUND GUIDED VENIPUNCTURE BY MD CONTRAST:  None. COMPLICATIONS: None immediate TECHNIQUE: The right upper arm was prepped with chlorhexidine in a sterile fashion, and a sterile drape was applied covering the operative field. Local anesthesia was provided with 1% lidocaine. Under direct ultrasound guidance, the right basilic vein was accessed with a micropuncture kit after the overlying soft tissues were anesthetized with 1% lidocaine. An ultrasound image was saved for documentation purposes. The micropuncture sheath easily aspirated and flushed and was secured in place. A dressing was placed. The patient tolerated the procedure well without immediate post procedural complication. IMPRESSION: Successful ultrasound guided placement of a right basilic vein approach micropuncture sheath for temporary venous access. Read by Candiss Norse, PA-C Electronically Signed   By: Corrie Mckusick D.O.   On: 03/26/2018 15:43    Microbiology: No results found for this or any previous visit (from the past 240 hour(s)).   Labs: Basic Metabolic Panel: Recent Labs  Lab 03/25/18 0125  NA 139  K 3.8  CL 103  CO2 26  GLUCOSE 117*  BUN 14  CREATININE 1.16*  CALCIUM 9.6   Liver Function Tests: No results for input(s): AST, ALT, ALKPHOS, BILITOT, PROT, ALBUMIN in the last 168 hours. No results for input(s): LIPASE, AMYLASE in the last 168 hours. No results for input(s): AMMONIA in the last 168 hours. CBC: Recent Labs  Lab 03/25/18 0125  WBC 8.7  HGB 11.0*  HCT 38.2  MCV 88.0  PLT 228   Cardiac Enzymes: Recent Labs  Lab 03/25/18 1300  TROPONINI <0.03    BNP: BNP (last 3 results) No results for input(s): BNP in the last 8760 hours.  ProBNP (last 3 results) No results for input(s): PROBNP in the last 8760 hours.  CBG: No results for input(s): GLUCAP in the last 168 hours.     Signed:  Radene Gunning NP [facility] 03/27/2018, 11:46 AM  Patient was seen, examined,treatment plan was discussed with the Advance Practice Provider.  I have personally reviewed the clinical findings, labs, EKG, imaging studies and management of this patient in detail. I have also reviewed the orders written for this patient which were under my direction. I agree with the documentation, as recorded by the Advance Practice Provider.   Stephanie Mcdaniel is a 78 y.o. female here with chest pain.  Stress test low risk.  Close outpatient follow up with Dr. Terrence Dupont.    Geradine Girt, DO  How to contact the [facility] Attending or Consulting provider [place] or covering provider during after hours [place], for this patient?  1.  Check the care team in [facility] and look for a) attending/consulting [facility] provider listed and b) the [facility] team listed 2. Log into [email or web address] and use [place]'s universal password to access. If you do not have the password, please contact the hospital operator. 3. Locate the [facility] provider you are looking for under [facility] and page to a number that you can be directly reached. 4. If you still have difficulty reaching the provider, please page the [facility] (Director on Call) for the Hospitalists listed on amion for assistance.

## 2018-08-11 ENCOUNTER — Other Ambulatory Visit: Payer: Self-pay | Admitting: Cardiology

## 2018-08-11 DIAGNOSIS — Z1231 Encounter for screening mammogram for malignant neoplasm of breast: Secondary | ICD-10-CM

## 2018-08-18 ENCOUNTER — Encounter: Payer: Self-pay | Admitting: Orthopaedic Surgery

## 2018-08-18 ENCOUNTER — Ambulatory Visit (INDEPENDENT_AMBULATORY_CARE_PROVIDER_SITE_OTHER): Payer: Medicare Other | Admitting: Orthopaedic Surgery

## 2018-08-18 ENCOUNTER — Other Ambulatory Visit: Payer: Self-pay

## 2018-08-18 ENCOUNTER — Ambulatory Visit: Payer: Self-pay

## 2018-08-18 DIAGNOSIS — M79602 Pain in left arm: Secondary | ICD-10-CM | POA: Diagnosis not present

## 2018-08-18 MED ORDER — METHYLPREDNISOLONE 4 MG PO TABS: ORAL_TABLET | ORAL | 0 refills | Status: DC

## 2018-08-18 NOTE — Progress Notes (Signed)
Office Visit Note   Patient: Stephanie Mcdaniel           Date of Birth: 10-31-1940           MRN: [id number] Visit Date: 08/18/2018              Requested by: Charolette Forward, MD [place] [address] [place] [employer],  [place] [address] PCP: Charolette Forward, MD   Assessment & Plan: Visit Diagnoses:  1. Left arm pain     Plan: I do feel she is likely had some type of injury to her biceps muscle based on clinical exam.  I gave her reassurance that this should heal with time but it can take about 6 to 8 weeks after her injury.  I want to put her on a Medrol Dosepak to help with inflammation and also recommend Voltaren gel as a topical anti-inflammatory in this area.  She agrees with this treatment plan.  All questions and concerns were answered and addressed.  Follow-up can be as needed.  Obviously if this fails to improve she will come back and see [place].  Follow-Up Instructions: Return if symptoms worsen or fail to improve.   Orders:  Orders Placed This Encounter  Procedures  . XR Humerus Left   Meds ordered this encounter  Medications  . methylPREDNISolone (MEDROL) 4 MG tablet    Sig: Medrol dose pack. Take as instructed    Dispense:  21 tablet    Refill:  0      Procedures: No procedures performed   Clinical Data: No additional findings.   Subjective: Chief Complaint  Patient presents with  . Left Arm - Pain  The patient comes in today for evaluation treatment of left arm pain.  She points to the mid humerus area as a source of her pain.  She feels like it is in the muscle.  She is a very pleasant 78 year old female.  She says it hurt quite a bit when she is reaching behind her recently and this was after doing some repetitive work in the kitchen reaching above her..  She denies any elbow pain and denies any shoulder pain on the left side.  She denies any neck issues.  She denies any numbness and tingling in the hand.  It is  painful for her with activities.  She has not  injured this area before or hurt it before.  It does bother her with activities of daily living as well.  She is not a diabetic. HPI  Review of Systems She currently denies any headache, chest pain, shortness of breath, fever, chills, nausea, vomiting  Objective: Vital Signs: There were no vitals taken for this visit.  Physical Exam She is alert and orient x3 and in no acute distress Ortho Exam Examination of her left upper extremity shows numbness and tingling.  Her shoulder and elbow move fine and the shoulder is well located.  When I put her shoulder through range of motion she does point to the mid humerus there is a source of her pain and the palpation over the biceps muscle does cause pain in this area.  There is no redness and no induration of the skin.  Her hand exam on that side is normal. Specialty Comments:  No specialty comments available.  Imaging: Xr Humerus Left  Result Date: 08/18/2018 2 views of the right humerus showed no acute findings.  There is no evidence of fracture.  The proximal humerus and elbow on these views  appear normal in terms of no trauma.    PMFS History: Patient Active Problem List   Diagnosis Date Noted  . Chest pain 03/25/2018  . Hypothyroidism (acquired)   . Hypertension   . Hyperlipidemia   . Fibromyalgia    Past Medical History:  Diagnosis Date  . Fibromyalgia   . Hyperlipidemia   . Hypertension   . Hypothyroidism (acquired)     Family History  Problem Relation Age of Onset  . CAD Son 35    Past Surgical History:  Procedure Laterality Date  . ABDOMINAL HYSTERECTOMY    . BACK SURGERY    . CARDIAC CATHETERIZATION N/A 03/06/2016   Procedure: Left Heart Cath and Coronary Angiography;  Surgeon: Charolette Forward, MD;  Location: [facility];  Service: Cardiovascular;  Laterality: N/A;  . CARPAL TUNNEL RELEASE    . COLON SURGERY    . IR RADIOLOGY PERIPHERAL GUIDED IV START  03/26/2018  . IR US GUIDE VASC ACCESS RIGHT  03/26/2018  .  THYROIDECTOMY     Social History   Occupational History  . Occupation: retired  Tobacco Use  . Smoking status: Never Smoker  . Smokeless tobacco: Never Used  Substance and Sexual Activity  . Alcohol use: No  . Drug use: No  . Sexual activity: Not on file

## 2018-08-25 ENCOUNTER — Other Ambulatory Visit: Payer: Self-pay

## 2018-08-25 ENCOUNTER — Emergency Department (HOSPITAL_COMMUNITY): Payer: Medicare Other

## 2018-08-25 ENCOUNTER — Encounter (HOSPITAL_COMMUNITY): Payer: Self-pay

## 2018-08-25 ENCOUNTER — Emergency Department (HOSPITAL_COMMUNITY)
Admission: EM | Admit: 2018-08-25 | Discharge: 2018-08-25 | Disposition: A | Discharge status: Home / Self Care | Payer: Medicare Other | Attending: Emergency Medicine | Admitting: Emergency Medicine

## 2018-08-25 DIAGNOSIS — I10 Essential (primary) hypertension: Secondary | ICD-10-CM | POA: Diagnosis not present

## 2018-08-25 DIAGNOSIS — Z79899 Other long term (current) drug therapy: Secondary | ICD-10-CM | POA: Insufficient documentation

## 2018-08-25 DIAGNOSIS — K219 Gastro-esophageal reflux disease without esophagitis: Secondary | ICD-10-CM | POA: Diagnosis not present

## 2018-08-25 DIAGNOSIS — R079 Chest pain, unspecified: Secondary | ICD-10-CM | POA: Diagnosis present

## 2018-08-25 DIAGNOSIS — E039 Hypothyroidism, unspecified: Secondary | ICD-10-CM | POA: Diagnosis not present

## 2018-08-25 DIAGNOSIS — R0789 Other chest pain: Secondary | ICD-10-CM

## 2018-08-25 DIAGNOSIS — Z7902 Long term (current) use of antithrombotics/antiplatelets: Secondary | ICD-10-CM | POA: Insufficient documentation

## 2018-08-25 LAB — BASIC METABOLIC PANEL
Anion gap: 7 (ref 5–15)
BUN: 27 mg/dL — ABNORMAL HIGH (ref 8–23)
CO2: 26 mmol/L (ref 22–32)
Calcium: 9 mg/dL (ref 8.9–10.3)
Chloride: 104 mmol/L (ref 98–111)
Creatinine, Ser: 1.05 mg/dL — ABNORMAL HIGH (ref 0.44–1.00)
GFR calc Af Amer: 60 mL/min — ABNORMAL LOW (ref >60)
GFR calc non Af Amer: 52 mL/min — ABNORMAL LOW (ref >60)
Glucose, Bld: 89 mg/dL (ref 70–99)
Potassium: 4.3 mmol/L (ref 3.5–5.1)
Sodium: 137 mmol/L (ref 135–145)

## 2018-08-25 LAB — CBC
HCT: 37.8 % (ref 36.0–46.0)
Hemoglobin: 11.4 g/dL — ABNORMAL LOW (ref 12.0–15.0)
MCH: 26.6 pg (ref 26.0–34.0)
MCHC: 30.2 g/dL (ref 30.0–36.0)
MCV: 88.3 fL (ref 80.0–100.0)
Platelets: 258 10*3/uL (ref 150–400)
RBC: 4.28 MIL/uL (ref 3.87–5.11)
RDW: 15.9 % — ABNORMAL HIGH (ref 11.5–15.5)
WBC: 13.8 10*3/uL — ABNORMAL HIGH (ref 4.0–10.5)
nRBC: 0 % (ref 0.0–0.2)

## 2018-08-25 LAB — TROPONIN I (HIGH SENSITIVITY)
Troponin I (High Sensitivity): 3 ng/L (ref <18)
Troponin I (High Sensitivity): <2 ng/L (ref <18)

## 2018-08-25 MED ORDER — SODIUM CHLORIDE 0.9% FLUSH: 3.0000 mL | Freq: Once | INTRAVENOUS | Status: DC

## 2018-08-25 MED ORDER — ALUM & MAG HYDROXIDE-SIMETH 200-200-20 MG/5ML PO SUSP
30.0000 mL | Freq: Once | ORAL | Status: AC
  Administered 2018-08-25: 03:00:00 30 mL via ORAL
  Filled 2018-08-25: qty 30

## 2018-08-25 MED ORDER — LIDOCAINE VISCOUS HCL 2 % MT SOLN
15.0000 mL | Freq: Once | OROMUCOSAL | Status: AC
  Administered 2018-08-25: 15 mL via ORAL
  Filled 2018-08-25: qty 15

## 2018-08-25 NOTE — ED Provider Notes (Signed)
[facility] EMERGENCY DEPARTMENT Provider Note  CSN: [id number] Arrival date & time: 08/25/18 0210  Chief Complaint(s) Chest Pain  HPI Stephanie Mcdaniel is a 78 y.o. female past medical history hypertension, hyperlipidemia who presents the emergency department with 12 hours of substernal chest pressure worse with lying supine and improved by sitting up.  Patient reports that she took Imdur earlier today that worsened her pain.  Took nitroglycerin several hours prior to arrival which provided no relief.  She endorses some nausea without emesis.  Mild shortness of breath.  Pain radiates to the left shoulder.  Pain is nonexertional.  No recent fevers or infections.  No abdominal pain.  Patient reports that she was recently placed on a 5-day taper of prednisone for muscle strain of her back.  Patient had catheterization in 2018 showing mild CAD. HPI  Past Medical History Past Medical History:  Diagnosis Date  . Fibromyalgia   . Hyperlipidemia   . Hypertension   . Hypothyroidism (acquired)    Patient Active Problem List   Diagnosis Date Noted  . Chest pain 03/25/2018  . Hypothyroidism (acquired)   . Hypertension   . Hyperlipidemia   . Fibromyalgia    Home Medication(s) Prior to Admission medications   Medication Sig Start Date End Date Taking? Authorizing Provider  acetaminophen (TYLENOL) 500 MG tablet Take 1,000 mg by mouth every 6 (six) hours as needed for moderate pain.    Yes [provider]  Ascorbic Acid (VITAMIN C) 1000 MG tablet Take 1,000 mg by mouth daily.   Yes [provider]  clopidogrel (PLAVIX) 75 MG tablet Take 75 mg by mouth daily.   Yes [provider]  isosorbide mononitrate (IMDUR) 30 MG 24 hr tablet Take 30 mg by mouth daily as needed (blood pressure).  03/03/16  Yes [provider]  levothyroxine (SYNTHROID) 50 MCG tablet Take 50 mcg by mouth daily. 07/02/18  Yes [provider]  nitroGLYCERIN  (NITROSTAT) 0.4 MG SL tablet Place 0.4 mg under the tongue every 5 (five) minutes as needed for chest pain.  02/13/16  Yes [provider]  Olmesartan-Amlodipine-HCTZ (TRIBENZOR) 40-10-25 MG TABS Take 1 tablet by mouth daily.   Yes [provider]  pantoprazole (PROTONIX) 40 MG tablet Take 40 mg by mouth daily.   Yes [provider]  rosuvastatin (CRESTOR) 20 MG tablet Take 20 mg by mouth every evening.    Yes [provider]  methylPREDNISolone (MEDROL) 4 MG tablet Medrol dose pack. Take as instructed Patient not taking: Reported on 08/25/2018 08/18/18   Mcarthur Rossetti, MD                                                                                                                                    Past Surgical History Past Surgical History:  Procedure Laterality Date  . ABDOMINAL HYSTERECTOMY    . BACK SURGERY    .  CARDIAC CATHETERIZATION N/A 03/06/2016   Procedure: Left Heart Cath and Coronary Angiography;  Surgeon: Charolette Forward, MD;  Location: [facility];  Service: Cardiovascular;  Laterality: N/A;  . CARPAL TUNNEL RELEASE    . COLON SURGERY    . IR RADIOLOGY PERIPHERAL GUIDED IV START  03/26/2018  . IR US GUIDE VASC ACCESS RIGHT  03/26/2018  . THYROIDECTOMY     Family History Family History  Problem Relation Age of Onset  . CAD Son 27    Social History Social History   Tobacco Use  . Smoking status: Never Smoker  . Smokeless tobacco: Never Used  Substance Use Topics  . Alcohol use: No  . Drug use: No   Allergies Aspirin, Contrast media [iodinated diagnostic agents], and Motrin [ibuprofen]  Review of Systems Review of Systems All other systems are reviewed and are negative for acute change except as noted in the HPI  Physical Exam Vital Signs  I have reviewed the triage vital signs BP (!) 135/54 (BP Location: Right Arm)   Pulse 62   Temp 98.7 F (37.1 C) (Oral)   Resp 16   SpO2 99%   Physical Exam Vitals signs  reviewed.  Constitutional:      General: She is not in acute distress.    Appearance: She is well-developed. She is not diaphoretic.  HENT:     Head: Normocephalic and atraumatic.     Nose: Nose normal.  Eyes:     General: No scleral icterus.       Right eye: No discharge.        Left eye: No discharge.     Conjunctiva/sclera: Conjunctivae normal.     Pupils: Pupils are equal, round, and reactive to light.  Neck:     Musculoskeletal: Normal range of motion and neck supple.  Cardiovascular:     Rate and Rhythm: Normal rate and regular rhythm.     Heart sounds: No murmur. No friction rub. No gallop.   Pulmonary:     Effort: Pulmonary effort is normal. No respiratory distress.     Breath sounds: Normal breath sounds. No stridor. No rales.  Abdominal:     General: There is no distension.     Palpations: Abdomen is soft.     Tenderness: There is no abdominal tenderness.  Musculoskeletal:        General: No tenderness.  Skin:    General: Skin is warm and dry.     Findings: No erythema or rash.  Neurological:     Mental Status: She is alert and oriented to person, place, and time.     ED Results and Treatments Labs (all labs ordered are listed, but only abnormal results are displayed) Labs Reviewed  BASIC METABOLIC PANEL - Abnormal; Notable for the following components:      Result Value   BUN 27 (*)    Creatinine, Ser 1.05 (*)    GFR calc non Af Amer 52 (*)    GFR calc Af Amer 60 (*)    All other components within normal limits  CBC - Abnormal; Notable for the following components:   WBC 13.8 (*)    Hemoglobin 11.4 (*)    RDW 15.9 (*)    All other components within normal limits  TROPONIN I (HIGH SENSITIVITY)  TROPONIN I (HIGH SENSITIVITY)  EKG  EKG Interpretation  Date/Time:  Wednesday August 25 2018 02:19:42 EDT Ventricular Rate:  67 PR Interval:   182 QRS Duration: 140 QT Interval:  424 QTC Calculation: 448 R Axis:   56 Text Interpretation:  Normal sinus rhythm Right bundle branch block Abnormal ECG No significant change since last tracing Confirmed by Addison Lank [id number]) on 08/25/2018 2:46:41 AM Also confirmed by Addison Lank [phone]), editor Philomena Doheny [id number])  on 08/25/2018 7:28:12 AM      Radiology Dg Chest 2 View  Result Date: 08/25/2018 CLINICAL DATA:  Chest pain EXAM: CHEST - 2 VIEW COMPARISON:  03/25/2018 FINDINGS: Small foci of scarring or atelectasis at the left base. No consolidation or effusion. Stable cardiomediastinal silhouette with aortic atherosclerosis. No pneumothorax. IMPRESSION: No active cardiopulmonary disease. Minimal scarring or atelectasis at the left base. Electronically Signed   By: Donavan Foil M.D.   On: 08/25/2018 03:00    Pertinent labs & imaging results that were available during my care of the patient were reviewed by me and considered in my medical decision making (see chart for details).  Medications Ordered in ED Medications  sodium chloride flush (NS) 0.9 % injection 3 mL (3 mLs Intravenous Not Given 08/25/18 0300)  alum & mag hydroxide-simeth (MAALOX/MYLANTA) 200-200-20 MG/5ML suspension 30 mL (30 mLs Oral Given 08/25/18 0258)    And  lidocaine (XYLOCAINE) 2 % viscous mouth solution 15 mL (15 mLs Oral Given 08/25/18 0258)                                                                                                                                    Procedures Procedures  (including critical care time)  Medical Decision Making / ED Course I have reviewed the nursing notes for this encounter and the patient's prior records (if available in EHR or on provided paperwork).   Stephanie Mcdaniel was evaluated in Emergency Department on 08/25/2018 for the symptoms described in the history of present illness. She was evaluated in the context of the global COVID-19 pandemic, which necessitated  consideration that the patient might be at risk for infection with the SARS-CoV-2 virus that causes COVID-19. Institutional protocols and algorithms that pertain to the evaluation of patients at risk for COVID-19 are in a state of rapid change based on information released by regulatory bodies including the CDC and federal and state organizations. These policies and algorithms were followed during the patient's care in the ED.  Chest pain is atypical.  Feel is most likely related to GI symptoms.  EKG without acute ischemic changes or evidence of pericarditis.  Initial troponin negative.  Given comorbidities will obtain a second troponin to rule out ACS.  Low suspicion for pulmonary embolism.  Presentation not classic for aortic dissection or esophageal perforation.  Chest x-ray without evidence suggestive of pneumonia, pneumothorax, pneumomediastinum.  No abnormal contour of the mediastinum to suggest dissection. No evidence of acute injuries.  Provided with GI cocktail which completely resolved the patient's symptoms.  On multiple reassessments patient had no recurrence of her pain.  Labs with leukocytosis likely from demargination from prednisone.  No significant electrolyte derangements.  Stable renal insufficiency.  Delta trop negative.  The patient appears reasonably screened and/or stabilized for discharge and I doubt any other medical condition or other [facility] requiring further screening, evaluation, or treatment in the ED at this time prior to discharge.  The patient is safe for discharge with strict return precautions.       Final Clinical Impression(s) / ED Diagnoses Final diagnoses:  Atypical chest pain  Gastroesophageal reflux disease, esophagitis presence not specified    The patient appears reasonably screened and/or stabilized for discharge and I doubt any other medical condition or other [facility] requiring further screening, evaluation, or treatment in the ED at this time prior  to discharge.  Disposition: Discharge  Condition: Good  I have discussed the results, Dx and Tx plan with the patient who expressed understanding and agree(s) with the plan. Discharge instructions discussed at great length. The patient was given strict return precautions who verbalized understanding of the instructions. No further questions at time of discharge.    ED Discharge Orders    None       Follow Up: Charolette Forward, MD [phone] [place] [address] [phone]  Schedule an appointment as soon as possible for a visit  As needed     This chart was dictated using voice recognition software.  Despite best efforts to proofread,  errors can occur which can change the documentation meaning.   Fatima Blank, MD 08/25/18 [phone]

## 2018-08-25 NOTE — ED Notes (Signed)
Patient verbalizes understanding of discharge instructions. Opportunity for questioning and answering were provided. Armband removed by staff , patient discharged from ED. Patients son here to pick patient up

## 2018-08-25 NOTE — ED Triage Notes (Signed)
Pt states that she has had central CP that has been going on since 4pm yesterday with radiation to L arm, dizziness, some SOB and nausea.

## 2018-09-27 ENCOUNTER — Other Ambulatory Visit: Payer: Self-pay

## 2018-09-27 ENCOUNTER — Ambulatory Visit
Admission: RE | Admit: 2018-09-27 | Discharge: 2018-09-27 | Disposition: A | Discharge status: Home / Self Care | Payer: Medicare Other | Source: Ambulatory Visit | Attending: Cardiology | Admitting: Cardiology

## 2018-09-27 DIAGNOSIS — Z1231 Encounter for screening mammogram for malignant neoplasm of breast: Secondary | ICD-10-CM

## 2019-08-28 IMAGING — MG DIGITAL SCREENING BILATERAL MAMMOGRAM WITH TOMO AND CAD
8 of 14 series · 8 of 40 positions shown · non-contrast
Comparison: Previous exam(s).

CLINICAL DATA: Screening.

EXAM:
DIGITAL SCREENING BILATERAL MAMMOGRAM WITH TOMO AND CAD

[R MLO synth-2D (1 of 2)]
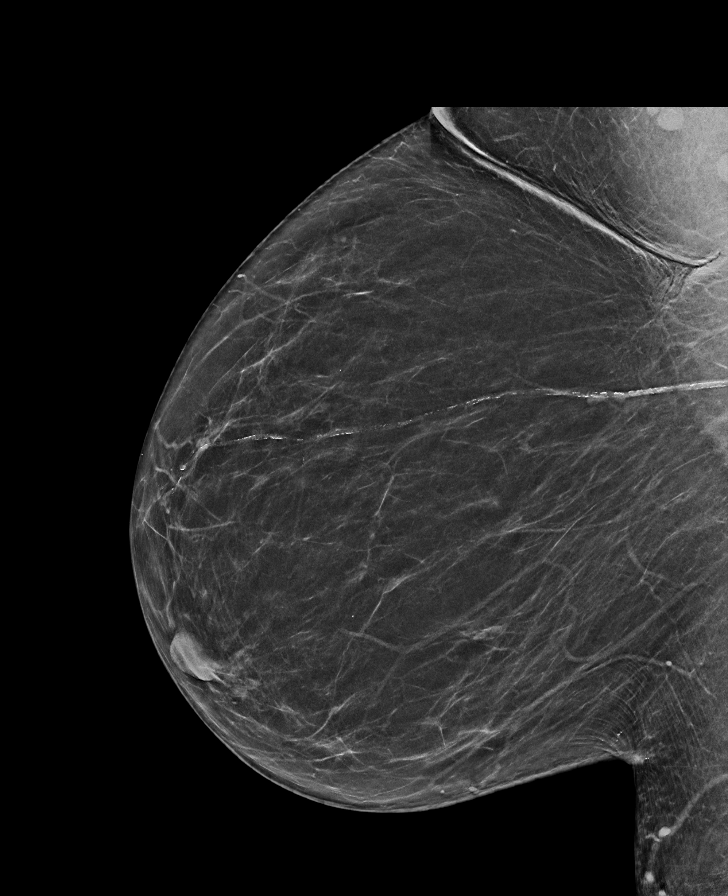

[L CC synth-2D (1 of 2)]
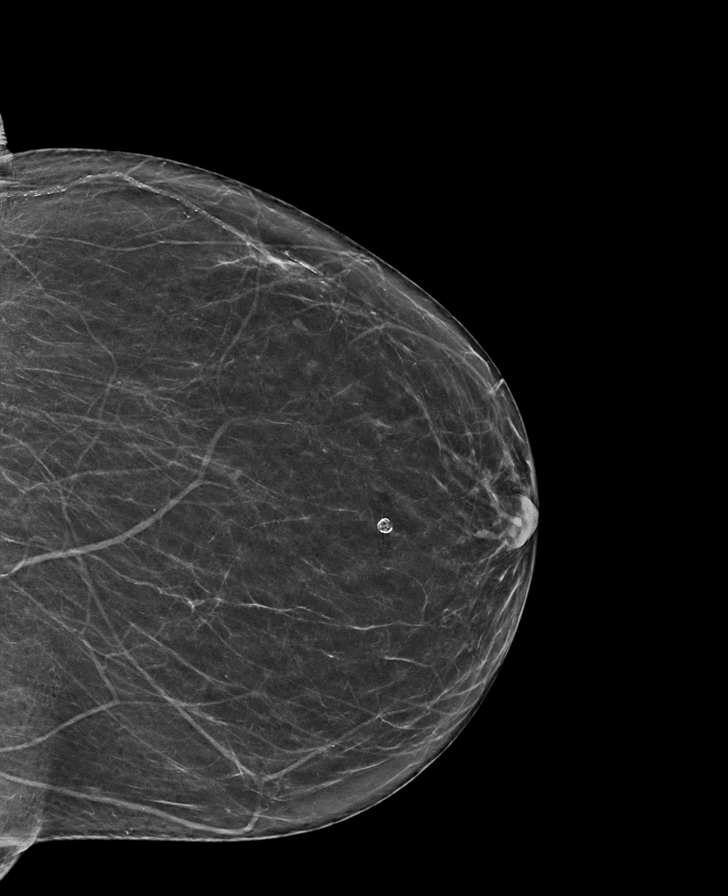

[R MLO synth-2D (2 of 2)]
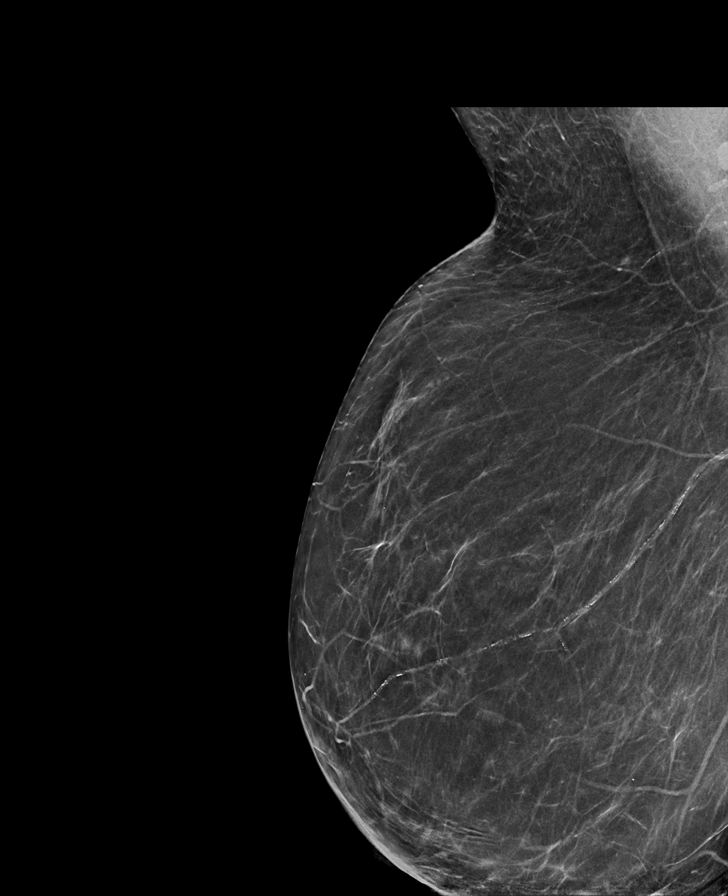

[R CC synth-2D (1 of 2)]
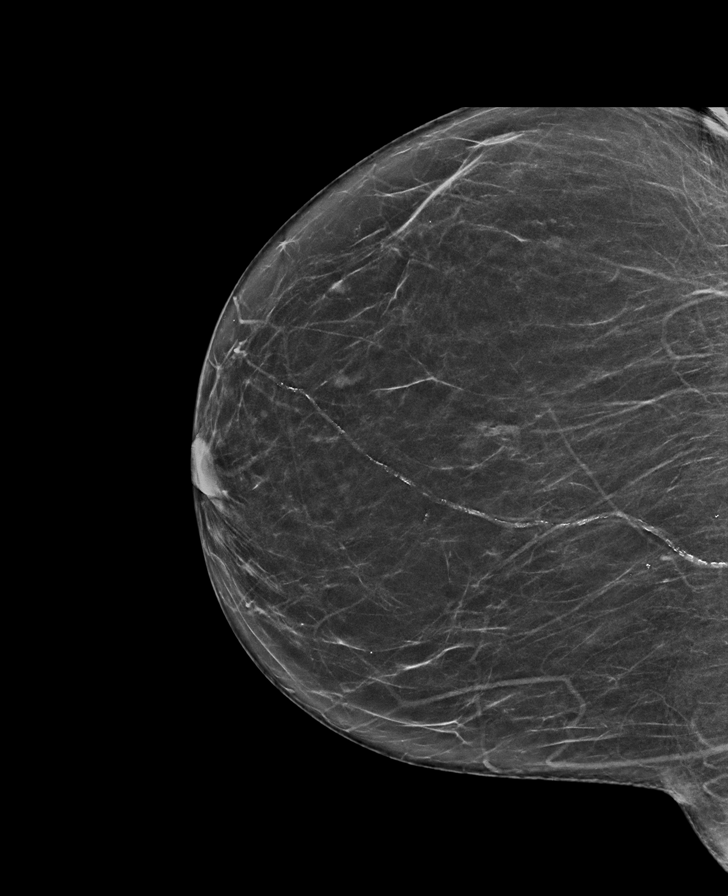

[L CC synth-2D (2 of 2)]
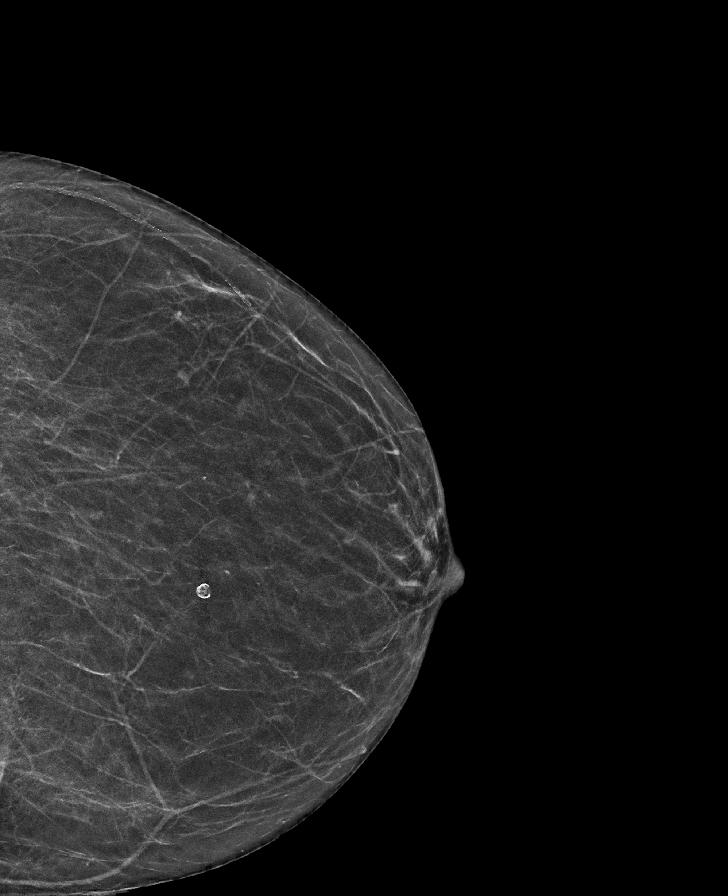

[R CC synth-2D (2 of 2)]
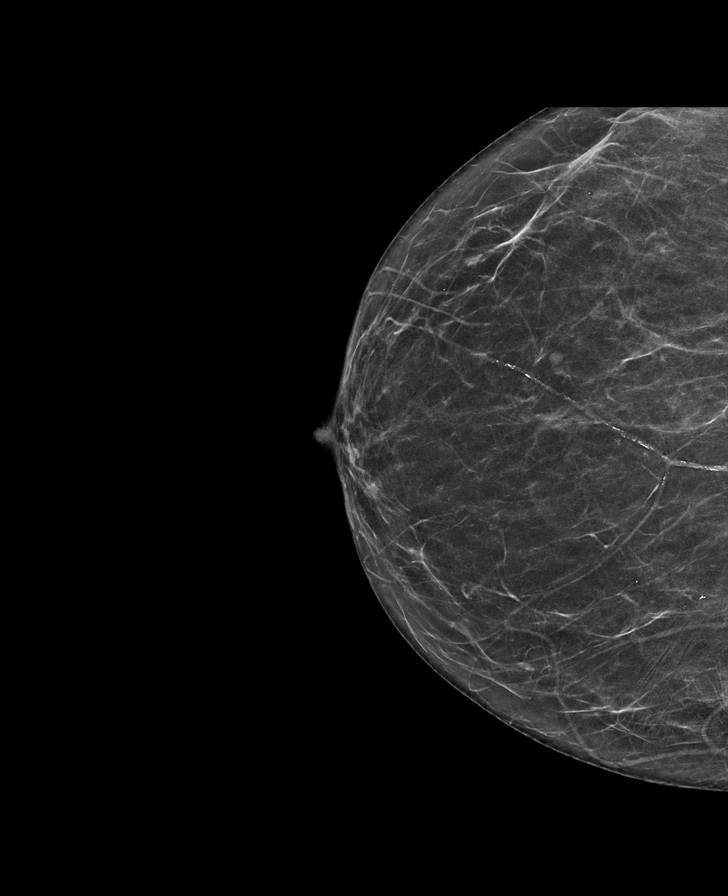

[L MLO synth-2D]
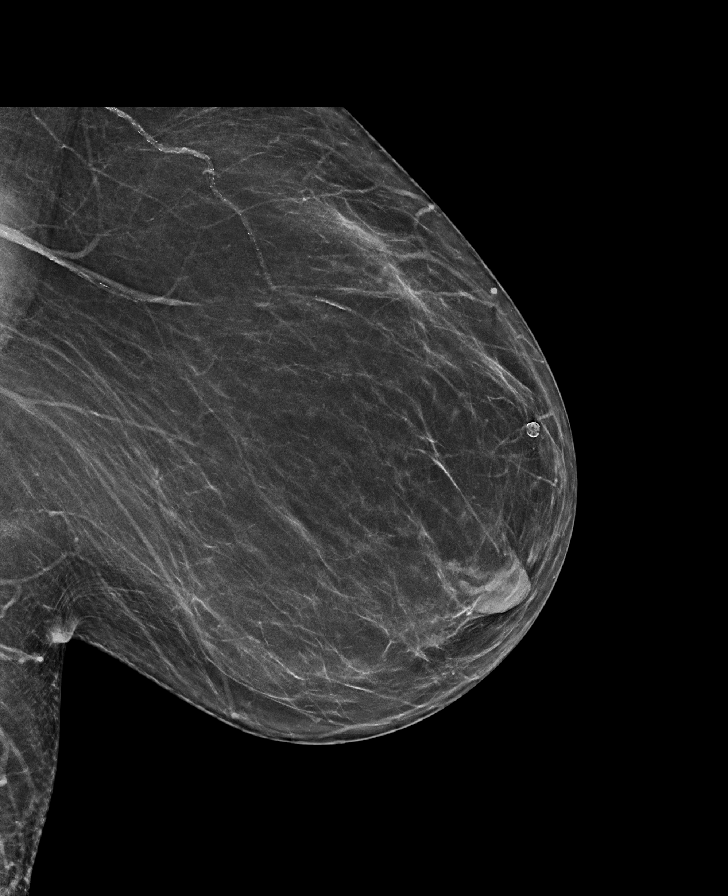

[L MLO tomo · tomo slice 35/68.0]
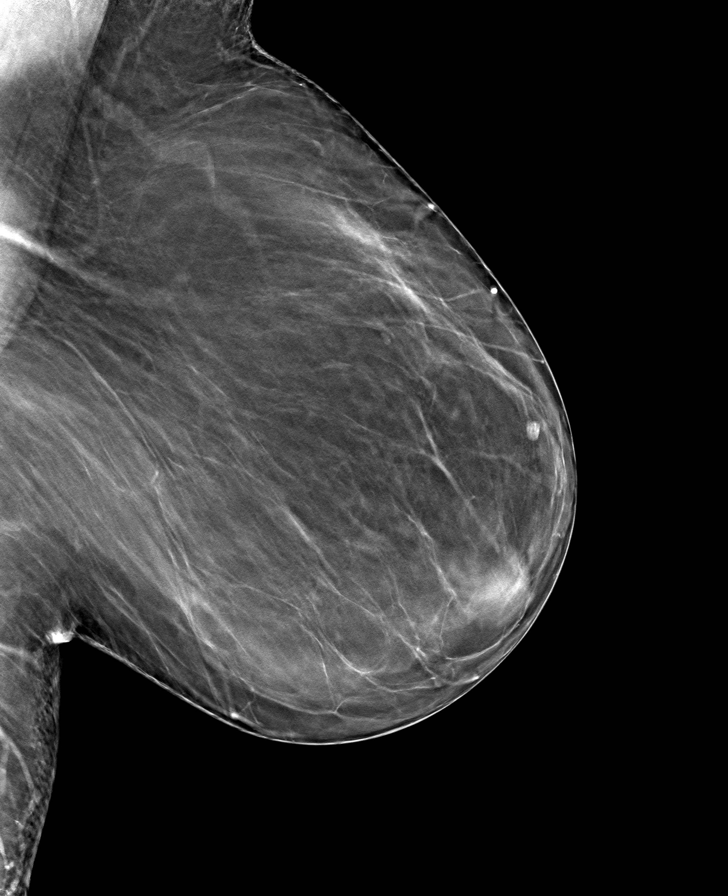

[8 of 40 positions shown; findings below may reference images not displayed]

ACR Breast Density Category b: There are scattered areas of
fibroglandular density.
FINDINGS: There are no findings suspicious for malignancy. Images were
processed with CAD.
IMPRESSION: No mammographic evidence of malignancy. A result letter of this
screening mammogram will be mailed directly to the patient.

RECOMMENDATION:
Screening mammogram in one year. (Code:CN-U-775)

BI-RADS CATEGORY  1: Negative.

## 2019-09-07 ENCOUNTER — Other Ambulatory Visit: Payer: Self-pay | Admitting: Cardiology

## 2019-09-07 DIAGNOSIS — Z1231 Encounter for screening mammogram for malignant neoplasm of breast: Secondary | ICD-10-CM

## 2019-09-07 DIAGNOSIS — R933 Abnormal findings on diagnostic imaging of other parts of digestive tract: Secondary | ICD-10-CM

## 2019-09-29 ENCOUNTER — Ambulatory Visit
Admission: RE | Admit: 2019-09-29 | Discharge: 2019-09-29 | Disposition: A | Discharge status: Home / Self Care | Payer: Medicare Other | Source: Ambulatory Visit | Attending: Cardiology | Admitting: Cardiology

## 2019-09-29 ENCOUNTER — Other Ambulatory Visit: Payer: Self-pay

## 2019-09-29 DIAGNOSIS — Z1231 Encounter for screening mammogram for malignant neoplasm of breast: Secondary | ICD-10-CM

## 2020-08-21 ENCOUNTER — Other Ambulatory Visit: Payer: Self-pay | Admitting: Cardiology

## 2020-08-21 DIAGNOSIS — Z1231 Encounter for screening mammogram for malignant neoplasm of breast: Secondary | ICD-10-CM

## 2020-10-16 ENCOUNTER — Other Ambulatory Visit: Payer: Self-pay

## 2020-10-16 ENCOUNTER — Ambulatory Visit
Admission: RE | Admit: 2020-10-16 | Discharge: 2020-10-16 | Disposition: A | Discharge status: Home / Self Care | Payer: Medicare Other | Source: Ambulatory Visit | Attending: Cardiology | Admitting: Cardiology

## 2020-10-16 DIAGNOSIS — Z1231 Encounter for screening mammogram for malignant neoplasm of breast: Secondary | ICD-10-CM

## 2021-06-28 ENCOUNTER — Emergency Department (HOSPITAL_COMMUNITY)
Admission: EM | Admit: 2021-06-28 | Discharge: 2021-06-29 | Disposition: A | Discharge status: Home / Self Care | Payer: Medicare Other | Attending: Emergency Medicine | Admitting: Emergency Medicine

## 2021-06-28 ENCOUNTER — Encounter (HOSPITAL_COMMUNITY): Payer: Self-pay | Admitting: Emergency Medicine

## 2021-06-28 ENCOUNTER — Emergency Department (HOSPITAL_COMMUNITY): Payer: Medicare Other

## 2021-06-28 ENCOUNTER — Other Ambulatory Visit: Payer: Self-pay

## 2021-06-28 DIAGNOSIS — R0602 Shortness of breath: Secondary | ICD-10-CM | POA: Insufficient documentation

## 2021-06-28 DIAGNOSIS — R079 Chest pain, unspecified: Secondary | ICD-10-CM | POA: Diagnosis present

## 2021-06-28 DIAGNOSIS — R002 Palpitations: Secondary | ICD-10-CM | POA: Insufficient documentation

## 2021-06-28 DIAGNOSIS — I10 Essential (primary) hypertension: Secondary | ICD-10-CM | POA: Insufficient documentation

## 2021-06-28 DIAGNOSIS — Z79899 Other long term (current) drug therapy: Secondary | ICD-10-CM | POA: Insufficient documentation

## 2021-06-28 DIAGNOSIS — Z7902 Long term (current) use of antithrombotics/antiplatelets: Secondary | ICD-10-CM | POA: Diagnosis not present

## 2021-06-28 DIAGNOSIS — R0789 Other chest pain: Secondary | ICD-10-CM

## 2021-06-28 LAB — BASIC METABOLIC PANEL
Anion gap: 6 (ref 5–15)
BUN: 16 mg/dL (ref 8–23)
CO2: 27 mmol/L (ref 22–32)
Calcium: 10 mg/dL (ref 8.9–10.3)
Chloride: 106 mmol/L (ref 98–111)
Creatinine, Ser: 1.05 mg/dL — ABNORMAL HIGH (ref 0.44–1.00)
GFR, Estimated: 54 mL/min — ABNORMAL LOW (ref >60)
Glucose, Bld: 114 mg/dL — ABNORMAL HIGH (ref 70–99)
Potassium: 3.7 mmol/L (ref 3.5–5.1)
Sodium: 139 mmol/L (ref 135–145)

## 2021-06-28 LAB — CBC
HCT: 36.5 % (ref 36.0–46.0)
Hemoglobin: 10.8 g/dL — ABNORMAL LOW (ref 12.0–15.0)
MCH: 26.9 pg (ref 26.0–34.0)
MCHC: 29.6 g/dL — ABNORMAL LOW (ref 30.0–36.0)
MCV: 90.8 fL (ref 80.0–100.0)
Platelets: 222 10*3/uL (ref 150–400)
RBC: 4.02 MIL/uL (ref 3.87–5.11)
RDW: 14.9 % (ref 11.5–15.5)
WBC: 7.1 10*3/uL (ref 4.0–10.5)
nRBC: 0 % (ref 0.0–0.2)

## 2021-06-28 LAB — TROPONIN I (HIGH SENSITIVITY)
Troponin I (High Sensitivity): 3 ng/L (ref <18)
Troponin I (High Sensitivity): 4 ng/L (ref <18)

## 2021-06-28 LAB — BRAIN NATRIURETIC PEPTIDE: B Natriuretic Peptide: 44 pg/mL (ref 0.0–100.0)

## 2021-06-28 LAB — D-DIMER, QUANTITATIVE: D-Dimer, Quant: 0.29 ug/mL-FEU (ref 0.00–0.50)

## 2021-06-28 NOTE — ED Provider Notes (Signed)
[facility] EMERGENCY DEPARTMENT Provider Note   CSN: [id number] Arrival date & time: 06/28/21  1833     History  Chief Complaint  Patient presents with   Chest Pain    Stephanie Mcdaniel is a 81 y.o. female.  With past medical history of hypertension, hyperlipidemia who presents to the emergency department with chest pain.  States she has had shortness of breath since yesterday.  She states that yesterday afternoon she had a somewhat sudden onset of shortness of breath.  She states that she was nonexertional at the time when it began.  She had associated palpitations at the time that subsided.  She states that then today she began having central to left-sided chest pain that radiated to her left shoulder.  She describes it as a heaviness with episodes of sharp pain.  She does state that it is intermittent and not constant.  No associated nausea or diaphoresis.  She again started having shortness of breath this evening which prompted her to present to the emergency department.  She denies any recent new cough or fever.  Denies a history of heart failure.  She denies any recent travel or flights, immobilization, tobacco use, estrogen use, cancer.  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 States she has had DVT since then but states it "was a long time ago."     Chest Pain Associated symptoms: palpitations and shortness of breath   Associated symptoms: no cough, no diaphoresis, no fever and no nausea       Home Medications Prior to Admission medications   Medication Sig Start Date End Date Taking? Authorizing Provider  acetaminophen (TYLENOL) 500 MG tablet Take 1,000 mg by mouth every 6 (six) hours as needed for moderate pain.     [provider]  Ascorbic Acid (VITAMIN C) 1000 MG tablet Take 1,000 mg by mouth daily.    [provider]  clopidogrel (PLAVIX) 75 MG tablet Take 75 mg by mouth daily.    [provider]  isosorbide mononitrate (IMDUR) 30 MG 24 hr tablet Take 30 mg by mouth daily as needed (blood pressure).  03/03/16   [provider]  levothyroxine (SYNTHROID) 50 MCG tablet Take 50 mcg by mouth daily. 07/02/18   [provider]  methylPREDNISolone (MEDROL) 4 MG tablet Medrol dose pack. Take as instructed Patient not taking: Reported on 08/25/2018 08/18/18   Mcarthur Rossetti, MD  nitroGLYCERIN (NITROSTAT) 0.4 MG SL tablet Place 0.4 mg under the tongue every 5 (five) minutes as needed for chest pain.  02/13/16   [provider]  Olmesartan-Amlodipine-HCTZ (TRIBENZOR) 40-10-25 MG TABS Take 1 tablet by mouth daily.    [provider]  pantoprazole (PROTONIX) 40 MG tablet Take 40 mg by mouth daily.    [provider]  rosuvastatin (CRESTOR) 20 MG tablet Take 20 mg by mouth every evening.     [provider]      Allergies    Aspirin, Contrast media [iodinated contrast media], and Motrin [ibuprofen]    Review of Systems   Review of Systems  Constitutional:  Negative for diaphoresis and fever.  Respiratory:  Positive for shortness of breath. Negative for cough and wheezing.   Cardiovascular:  Positive for chest pain and palpitations.  Gastrointestinal:  Negative for nausea.  Neurological:  Negative for syncope.  All other systems reviewed and are negative.  Physical Exam Updated Vital Signs BP Marland Kitchen)  121/95 (BP Location: Right Arm)   Pulse 68   Temp 98.7 F (37.1 C) (Oral)   Resp 16   SpO2 98%  Physical Exam Vitals and nursing note reviewed.  Constitutional:      General: She is not in acute distress.    Appearance: Normal appearance. She is well-developed. She is obese. She is ill-appearing. She is not toxic-appearing.  HENT:     Head: Normocephalic and atraumatic.  Eyes:     General: No scleral icterus.    Extraocular Movements: Extraocular movements intact.     Pupils: Pupils are equal, round, and reactive to  light.  Neck:     Vascular: No JVD.  Cardiovascular:     Rate and Rhythm: Normal rate and regular rhythm.     Pulses:          Radial pulses are 2+ on the right side and 2+ on the left side.     Heart sounds: Heart sounds are distant. No murmur heard.    Comments: Trace bilateral lower extremity pitting edema Pulmonary:     Effort: Tachypnea present. No respiratory distress.     Breath sounds: Examination of the right-lower field reveals rales. Examination of the left-lower field reveals rales. Decreased breath sounds and rales present.  Chest:     Chest wall: No tenderness.  Abdominal:     General: Bowel sounds are normal.     Palpations: Abdomen is soft.  Musculoskeletal:     Cervical back: Neck supple.     Right lower leg: Edema present.     Left lower leg: Edema present.  Skin:    General: Skin is warm and dry.     Capillary Refill: Capillary refill takes less than 2 seconds.  Neurological:     General: No focal deficit present.     Mental Status: She is alert and oriented to person, place, and time.  Psychiatric:        Mood and Affect: Mood normal.        Behavior: Behavior normal.    ED Results / Procedures / Treatments   Labs (all labs ordered are listed, but only abnormal results are displayed) Labs Reviewed  BASIC METABOLIC PANEL - Abnormal; Notable for the following components:      Result Value   Glucose, Bld 114 (*)    Creatinine, Ser 1.05 (*)    GFR, Estimated 54 (*)    All other components within normal limits  CBC - Abnormal; Notable for the following components:   Hemoglobin 10.8 (*)    MCHC 29.6 (*)    All other components within normal limits  BRAIN NATRIURETIC PEPTIDE  D-DIMER, QUANTITATIVE  TROPONIN I (HIGH SENSITIVITY)  TROPONIN I (HIGH SENSITIVITY)   EKG EKG Interpretation  Date/Time:  Friday Jun 28 2021 19:11:01 EDT Ventricular Rate:  66 PR Interval:  210 QRS Duration: 158 QT Interval:  442 QTC Calculation: 463 R Axis:   165 Text  Interpretation: Sinus rhythm with 1st degree A-V block Right bundle branch block Left posterior fascicular block Bifascicular block similar to 2020 Confirmed by Sherwood Gambler [id number]) on 06/28/2021 9:16:09 PM  Radiology DG Chest 1 View  Result Date: 06/28/2021 CLINICAL DATA:  Chest pain. EXAM: CHEST  1 VIEW COMPARISON:  Chest x-ray 08/11/2017 FINDINGS: The aorta is tortuous. Heart is normal in size. There is minimal linear atelectasis in the left lung base. No pleural effusion or pneumothorax identified. No acute fractures are seen. IMPRESSION: 1. Left basilar atelectasis. Electronically  Signed   By: Ronney Asters M.D.   On: 06/28/2021 20:04    Procedures Procedures   Medications Ordered in ED Medications - No data to display  ED Course/ Medical Decision Making/ A&P                           Medical Decision Making Amount and/or Complexity of Data Reviewed Labs: ordered.  This patient presents to the ED for concern of chest pain, this involves an extensive number of treatment options, and is a complaint that carries with it a high risk of complications and morbidity.  The differential diagnosis includes Acute chest syndrome, stable angina, atypical angina, pulmonary embolism, pneumothorax, dissection, pleural effusion, CHF, COPD, asthma, myocarditis, pericarditis, chest wall pain   Co morbidities that complicate the patient evaluation HTN, HLD  Additional history obtained:  Additional history obtained from: none  External records from outside source obtained and reviewed including: most recent cardiology physician note Dr. Terrence Dupont  EKG: EKG: normal sinus rhythm, RBBB, 1st degree AV block.   Cardiac Monitoring: The patient was maintained on a cardiac monitor.  I personally viewed and interpreted the cardiac monitored which showed an underlying rhythm of: sinus rhythm   Lab Results: I personally ordered, reviewed, and interpreted labs. Pertinent results include: CBC within normal  limits  BMP within normal limits  BNP 44, negative  Troponin x2 negative  D-dimer negative  Imaging Studies ordered:  I ordered imaging studies which included x-ray.  I independently reviewed & interpreted imaging & am in agreement with radiology impression. Imaging shows: CXR left basilar atelectasis without other acute abnormalities   Medications  -I reviewed the patient's home medications and did not make adjustments. -I did not prescribe new home medications.  Tests Considered: No further testing at this time  SDH None identified   ED Course: 81 year old female who presents to the emergency department with chest pain. Physical exam without evidence of overt fluid volume overload.  BNP negative, doubt acute heart failure at this time. EKG without new changes, no evidence of ischemia or infarction.  Troponin x2 negative.  Doubt ACS at this time. She does have a history of PE and is anticoagulated on Xarelto.  Obtain D-dimer given her shortness of breath which was negative.  Will not pursue CTA PE study at this time. She is normotensive, doubt hypertensive urgency or emergency as a component of her chest pain or shortness of breath. She has no history of COPD or asthma.  Her lungs are clear bilaterally.  Doubt a pneumonia.  Chest x-ray without pneumothorax, pleural effusion, pneumonia.  Symptoms are also not consistent with myocarditis, pericarditis or dissection. HEAR Score: 6    Patient was also evaluated by attending, Dr. Dayna Barker given her heart score and age.  Unclear etiology of her chest pain at this time.  After being in the emergency department for over 6 hours she has no change in her clinical status.  Feel that she can be appropriately discharged with close outpatient cardiology follow-up.  I have sent a message to Dr. Terrence Dupont for her to have a close follow-up in the next week if possible.  Patient is agreeable to this plan.  Given strict return precautions.  She verbalized  understanding. After consideration of the diagnostic results and the patients response to treatment, I feel that the patent would benefit from discharge.  Considered admission for observation, however discussing with attending and patient work-up, feel that she can  be appropriately dispositioned to outpatient follow-up with cardiology.. The patient has been appropriately medically screened and/or stabilized in the ED. I have low suspicion for any other emergent medical condition which would require further screening, evaluation or treatment in the ED or require inpatient management. The patient is overall well appearing and non-toxic in appearance. They are hemodynamically stable at time of discharge.   Final Clinical Impression(s) / ED Diagnoses Final diagnoses:  Atypical chest pain    Rx / DC Orders ED Discharge Orders     None         Mickie Hillier, PA-C 06/29/21 1524    Mesner, Corene Cornea, MD 06/29/21 2243

## 2021-06-28 NOTE — ED Provider Triage Note (Signed)
Emergency Medicine Provider Triage Evaluation Note  Stephanie Mcdaniel , a 81 y.o. female  was evaluated in triage.  Pt complains of chest pain and shortness of breath onset today.  Patient has a past medical history of MI in 2007.  Denies past medical history of cardiac catheterization or stents placements.  Her cardiologist is Dr. Terrence Dupont.  Patient notes she took a nitroglycerin at 4:30 PM today without relief of her symptoms.  Denies abdominal pain, nausea, vomiting.  Denies past medication of CHF.  Review of Systems  Positive: As per HPI above Negative:   Physical Exam  BP (!) 121/95 (BP Location: Right Arm)   Pulse 68   Temp 98.7 F (37.1 C) (Oral)   Resp 16   SpO2 98%  Gen:   Awake, no distress   Resp:  Normal effort  MSK:   Moves extremities without difficulty  Other:  No chest wall tenderness to palpation.  No leg edema noted bilaterally.  Medical Decision Making  Medically screening exam initiated at 7:17 PM.  Appropriate orders placed.  Carl Best was informed that the remainder of the evaluation will be completed by another provider, this initial triage assessment does not replace that evaluation, and the importance of remaining in the ED until their evaluation is complete.  Work-up initiated   Aleksa Catterton A, PA-C 06/28/21 1920

## 2021-06-28 NOTE — ED Triage Notes (Signed)
Patient reports mid/left chest pain this evening with SOB , no emesis or diaphoresis , denies cough or fever . Her cardiologist is Dr. Terrence Dupont .

## 2021-06-29 NOTE — ED Notes (Signed)
Discharge instructions reviewed with patient. Patient verbalized understanding of instructions. Follow-up care and medications were reviewed. Patient ambulatory with steady gait. VSS upon discharge.  ?

## 2021-06-29 NOTE — Discharge Instructions (Signed)
You were seen in the emergency department today for chest pain and shortness of breath.  Your work-up here has been reassuring.  Your chest x-ray, labs and EKG all appear to be normal.  I am not sure why you are having chest pain but feel that it is important for you to follow-up with Dr. Terrence Dupont, your cardiologist, so that he can assess you given your recent symptoms.  Please return to the emergency department for any worsening symptoms.

## 2021-09-05 ENCOUNTER — Other Ambulatory Visit: Payer: Self-pay | Admitting: Cardiology

## 2021-09-05 DIAGNOSIS — Z1231 Encounter for screening mammogram for malignant neoplasm of breast: Secondary | ICD-10-CM

## 2021-10-18 ENCOUNTER — Ambulatory Visit: Payer: Medicare Other

## 2021-11-14 ENCOUNTER — Ambulatory Visit
Admission: RE | Admit: 2021-11-14 | Discharge: 2021-11-14 | Disposition: A | Discharge status: Home / Self Care | Payer: Medicare Other | Source: Ambulatory Visit | Attending: Cardiology | Admitting: Cardiology

## 2021-11-14 DIAGNOSIS — Z1231 Encounter for screening mammogram for malignant neoplasm of breast: Secondary | ICD-10-CM

## 2021-12-30 ENCOUNTER — Other Ambulatory Visit: Payer: Self-pay | Admitting: Gastroenterology

## 2022-02-12 ENCOUNTER — Encounter (HOSPITAL_COMMUNITY): Payer: Self-pay | Admitting: Gastroenterology

## 2022-02-18 ENCOUNTER — Ambulatory Visit (HOSPITAL_COMMUNITY)
Admission: RE | Admit: 2022-02-18 | Discharge: 2022-02-18 | Disposition: A | Discharge status: Home / Self Care | Payer: Medicare Other | Source: Ambulatory Visit | Attending: Gastroenterology | Admitting: Gastroenterology

## 2022-02-18 ENCOUNTER — Ambulatory Visit (HOSPITAL_BASED_OUTPATIENT_CLINIC_OR_DEPARTMENT_OTHER): Payer: Medicare Other | Admitting: Certified Registered"

## 2022-02-18 ENCOUNTER — Encounter (HOSPITAL_COMMUNITY): Payer: Self-pay | Admitting: Gastroenterology

## 2022-02-18 ENCOUNTER — Other Ambulatory Visit: Payer: Self-pay

## 2022-02-18 ENCOUNTER — Encounter (HOSPITAL_COMMUNITY)
Admission: RE | Disposition: A | Discharge status: Home / Self Care | Payer: Self-pay | Source: Ambulatory Visit | Attending: Gastroenterology

## 2022-02-18 ENCOUNTER — Ambulatory Visit (HOSPITAL_COMMUNITY): Payer: Medicare Other | Admitting: Certified Registered"

## 2022-02-18 DIAGNOSIS — Z980 Intestinal bypass and anastomosis status: Secondary | ICD-10-CM

## 2022-02-18 DIAGNOSIS — D124 Benign neoplasm of descending colon: Secondary | ICD-10-CM | POA: Insufficient documentation

## 2022-02-18 DIAGNOSIS — I252 Old myocardial infarction: Secondary | ICD-10-CM | POA: Insufficient documentation

## 2022-02-18 DIAGNOSIS — Z9049 Acquired absence of other specified parts of digestive tract: Secondary | ICD-10-CM | POA: Insufficient documentation

## 2022-02-18 DIAGNOSIS — I10 Essential (primary) hypertension: Secondary | ICD-10-CM

## 2022-02-18 DIAGNOSIS — Z8719 Personal history of other diseases of the digestive system: Secondary | ICD-10-CM | POA: Diagnosis not present

## 2022-02-18 DIAGNOSIS — D1779 Benign lipomatous neoplasm of other sites: Secondary | ICD-10-CM | POA: Diagnosis not present

## 2022-02-18 DIAGNOSIS — D122 Benign neoplasm of ascending colon: Secondary | ICD-10-CM

## 2022-02-18 DIAGNOSIS — D175 Benign lipomatous neoplasm of intra-abdominal organs: Secondary | ICD-10-CM

## 2022-02-18 DIAGNOSIS — E890 Postprocedural hypothyroidism: Secondary | ICD-10-CM | POA: Diagnosis not present

## 2022-02-18 DIAGNOSIS — R1033 Periumbilical pain: Secondary | ICD-10-CM

## 2022-02-18 DIAGNOSIS — Z6839 Body mass index (BMI) 39.0-39.9, adult: Secondary | ICD-10-CM

## 2022-02-18 DIAGNOSIS — Z6841 Body Mass Index (BMI) 40.0 and over, adult: Secondary | ICD-10-CM | POA: Diagnosis not present

## 2022-02-18 HISTORY — PX: ESOPHAGOGASTRODUODENOSCOPY (EGD) WITH PROPOFOL: SHX5813

## 2022-02-18 HISTORY — PX: COLONOSCOPY WITH PROPOFOL: SHX5780

## 2022-02-18 HISTORY — PX: POLYPECTOMY: SHX5525

## 2022-02-18 SURGERY — ESOPHAGOGASTRODUODENOSCOPY (EGD) WITH PROPOFOL
Anesthesia: Monitor Anesthesia Care

## 2022-02-18 MED ORDER — PROPOFOL 10 MG/ML IV BOLUS
INTRAVENOUS | Status: DC | PRN
  Administered 2022-02-18: 30 mg via INTRAVENOUS

## 2022-02-18 MED ORDER — PROPOFOL 500 MG/50ML IV EMUL
INTRAVENOUS | Status: DC | PRN
  Administered 2022-02-18: 150 ug/kg/min via INTRAVENOUS

## 2022-02-18 MED ORDER — LIDOCAINE HCL (CARDIAC) PF 100 MG/5ML IV SOSY
PREFILLED_SYRINGE | INTRAVENOUS | Status: DC | PRN
  Administered 2022-02-18: 40 mg via INTRAVENOUS
  Administered 2022-02-18: 60 mg via INTRAVENOUS

## 2022-02-18 MED ORDER — EPHEDRINE SULFATE-NACL 50-0.9 MG/10ML-% IV SOSY
PREFILLED_SYRINGE | INTRAVENOUS | Status: DC | PRN
  Administered 2022-02-18 (×2): 5 mg via INTRAVENOUS

## 2022-02-18 MED ORDER — DEXMEDETOMIDINE HCL IN NACL 80 MCG/20ML IV SOLN
INTRAVENOUS | Status: DC | PRN
  Administered 2022-02-18: 4 ug via BUCCAL

## 2022-02-18 MED ORDER — SODIUM CHLORIDE 0.9 % IV SOLN: INTRAVENOUS | Status: DC

## 2022-02-18 MED ORDER — PHENYLEPHRINE 80 MCG/ML (10ML) SYRINGE FOR IV PUSH (FOR BLOOD PRESSURE SUPPORT)
PREFILLED_SYRINGE | INTRAVENOUS | Status: DC | PRN
  Administered 2022-02-18 (×4): 80 ug via INTRAVENOUS

## 2022-02-18 MED ORDER — LACTATED RINGERS IV SOLN: Freq: Once | INTRAVENOUS | Status: AC

## 2022-02-18 SURGICAL SUPPLY — 25 items

## 2022-02-18 NOTE — Transfer of Care (Signed)
Immediate Anesthesia Transfer of Care Note  Patient: Stephanie Mcdaniel  Procedure(s) Performed: ESOPHAGOGASTRODUODENOSCOPY (EGD) WITH PROPOFOL COLONOSCOPY WITH PROPOFOL POLYPECTOMY  Patient Location: PACU and Endoscopy Unit  Anesthesia Type:MAC  Level of Consciousness: drowsy  Airway & Oxygen Therapy: Patient Spontanous Breathing and Patient connected to face mask oxygen  Post-op Assessment: Report given to RN and Post -op Vital signs reviewed and stable  Post vital signs: Reviewed and stable  Last Vitals:  Vitals Value Taken Time  BP 96/48 02/18/22 1321  Temp    Pulse 71 02/18/22 1322  Resp 19 02/18/22 1322  SpO2 100 % 02/18/22 1322  Vitals shown include unvalidated device data.  Last Pain:  Vitals:   02/18/22 1214  TempSrc: Tympanic  PainSc: 0-No pain         Complications: No notable events documented.

## 2022-02-18 NOTE — Op Note (Signed)
[facility] Patient Name: Stephanie Mcdaniel Procedure Date: 02/18/2022 MRN: [id number] Attending MD: Carol Ada , MD, [id number] Date of Birth: 28-Aug-1940 CSN: [id number] Age: 82 Admit Type: Outpatient Procedure:                Colonoscopy Indications:              Periumbilical abdominal pain Providers:                Carol Ada, MD, Adah Perl RN, RN,                            William Dalton, Technician Referring MD:              Medicines:                Propofol per Anesthesia Complications:            No immediate complications. Estimated Blood Loss:     Estimated blood loss: none. Procedure:                Pre-Anesthesia Assessment:                           - Prior to the procedure, a History and Physical                            was performed, and patient medications and                            allergies were reviewed. The patient's tolerance of                            previous anesthesia was also reviewed. The risks                            and benefits of the procedure and the sedation                            options and risks were discussed with the patient.                            All questions were answered, and informed consent                            was obtained. Prior Anticoagulants: The patient has                            taken no anticoagulant or antiplatelet agents. ASA                            Grade Assessment: III - A patient with severe                            systemic disease. After reviewing the risks and  benefits, the patient was deemed in satisfactory                            condition to undergo the procedure.                           - Sedation was administered by an anesthesia                            professional. Deep sedation was attained.                           After obtaining informed consent, the colonoscope                            was passed under direct  vision. Throughout the                            procedure, the patient's blood pressure, pulse, and                            oxygen saturations were monitored continuously. The                            PCF-HQ190L ([id number] Olympus colonoscope was                            introduced through the anus and advanced to the the                            cecum, identified by appendiceal orifice and                            ileocecal valve. The colonoscopy was performed                            without difficulty. The patient tolerated the                            procedure well. The quality of the bowel                            preparation was evaluated using the BBPS [facility]                            Bowel Preparation Scale) with scores of: Right                            Colon = 3, Transverse Colon = 3 and Left Colon = 3                            (entire mucosa seen well with no residual staining,  small fragments of stool or opaque liquid). The                            total BBPS score equals 9. The ileocecal valve,                            appendiceal orifice, and rectum were photographed. Scope In: 1:00:21 PM Scope Out: 1:15:20 PM Scope Withdrawal Time: 0 hours 11 minutes 55 seconds  Total Procedure Duration: 0 hours 14 minutes 59 seconds  Findings:      Two sessile polyps were found in the descending colon and ascending       colon. The polyps were 3 to 4 mm in size. These polyps were removed with       a cold snare. Resection and retrieval were complete.      There was evidence of a prior end-to-end colo-colonic anastomosis in the       recto-sigmoid colon. This was patent and was characterized by healthy       appearing mucosa. The anastomosis was traversed. It was 10 cm from the       anal verge.      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 Impression:               - Two 3 to 4 mm polyps in the descending colon and                             in the ascending colon, removed with a cold snare.                            Resected and retrieved.                           - Patent end-to-end colo-colonic anastomosis,                            characterized by healthy appearing mucosa.                           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 Moderate Sedation:      Not Applicable - Patient had care per Anesthesia. Recommendation:           - Patient has a contact number available for                            emergencies. The signs and symptoms of potential                            delayed complications were discussed with the                            patient. Return to normal activities tomorrow.                            Written discharge instructions were provided to the  patient.                           - Resume previous diet.                           - Continue present medications.                           - Repeat colonoscopy is not recommended for                            surveillance. Procedure Code(s):        --- Professional ---                           [phone], Colonoscopy, flexible; with removal of                            tumor(s), polyp(s), or other lesion(s) by snare                            technique Diagnosis Code(s):        --- Professional ---                           D12.4, Benign neoplasm of descending colon                           D12.2, Benign neoplasm of ascending colon                           Z98.0, Intestinal bypass and anastomosis status                           D17.5, Benign lipomatous neoplasm of                            intra-abdominal organs                           B84.66, Periumbilical pain CPT copyright 2022 American Medical Association. All rights reserved. The codes documented in this report are preliminary and upon coder review may  be revised to meet current compliance requirements. Carol Ada, MD Carol Ada,  MD 02/18/2022 1:26:28 PM This report has been signed electronically. Number of Addenda: 0

## 2022-02-18 NOTE — Anesthesia Postprocedure Evaluation (Signed)
Anesthesia Post Note  Patient: Letica A Dunsworth  Procedure(s) Performed: ESOPHAGOGASTRODUODENOSCOPY (EGD) WITH PROPOFOL COLONOSCOPY WITH PROPOFOL POLYPECTOMY     Patient location during evaluation: Endoscopy Anesthesia Type: MAC Level of consciousness: awake and alert Pain management: pain level controlled Vital Signs Assessment: post-procedure vital signs reviewed and stable Respiratory status: spontaneous breathing, nonlabored ventilation, respiratory function stable and patient connected to nasal cannula oxygen Cardiovascular status: blood pressure returned to baseline and stable Postop Assessment: no apparent nausea or vomiting Anesthetic complications: no  No notable events documented.  Last Vitals:  Vitals:   02/18/22 1214  BP: (!) 127/56  Resp: 10  Temp: 36.5 C  SpO2: 97%    Last Pain:  Vitals:   02/18/22 1214  TempSrc: Tympanic  PainSc: 0-No pain                 Amayrany Cafaro L Timothy Trudell

## 2022-02-18 NOTE — Discharge Instructions (Signed)

## 2022-02-18 NOTE — H&P (Signed)
Stephanie Mcdaniel HPI: For the past two years she reports a worsening of her intermittent abdominal pain.  There is no association with PO intake or her bowel movements.  She does have some problems with constipation and she does use an over-the-counter laxative to help her symptoms.  She denies any nausea, vomiting, or weight loss with her pain.  The pain intensity varies from being very sharp to a dull ache.  The pain is typically in the mid abdomen.  She has a history of an MI many years ago.  Chest pain occurs with being stressed and there is an associated SOB.  The middle of last month is the last time she used SL NTG.  She had a normal screening colonoscopy in 2006 and she is s/p partial colectomy for diverticular disease in 2003.  Past Medical History:  Diagnosis Date   Fibromyalgia    Hyperlipidemia    Hypertension    Hypothyroidism (acquired)     Past Surgical History:  Procedure Laterality Date   ABDOMINAL HYSTERECTOMY     BACK SURGERY     CARDIAC CATHETERIZATION N/A 03/06/2016   Procedure: Left Heart Cath and Coronary Angiography;  Surgeon: Charolette Forward, MD;  Location: [place] [facility];  Service: Cardiovascular;  Laterality: N/A;   CARPAL TUNNEL RELEASE     COLON SURGERY     IR RADIOLOGY PERIPHERAL GUIDED IV START  03/26/2018   IR US GUIDE VASC ACCESS RIGHT  03/26/2018   THYROIDECTOMY      Family History  Problem Relation Age of Onset   CAD Son 40   Breast cancer Neg Hx     Social History:  reports that she has never smoked. She has never used smokeless tobacco. She reports that she does not drink alcohol and does not use drugs.  Allergies:  Allergies  Allergen Reactions   Aspirin Shortness Of Breath and Swelling    Tongue swelling   Contrast Media [Iodinated Contrast Media] Shortness Of Breath and Swelling    Tongue swelling    Motrin [Ibuprofen] Shortness Of Breath and Swelling    Tongue swelling    Codeine Swelling and Palpitations    Tongue swelling      Medications: Scheduled: Continuous:  sodium chloride     lactated ringers      No results found for this or any previous visit (from the past 24 hour(s)).   No results found.  ROS:  As stated above in the HPI otherwise negative.  Height '5\' 5"'$  (1.651 m), weight 102.1 kg.    PE: Gen: NAD, Alert and Oriented HEENT:  [place]/AT, EOMI Neck: Supple, no LAD Lungs: CTA Bilaterally CV: RRR without M/G/R ABD: Soft, NTND, +BS Ext: No C/C/E  Assessment/Plan: 1) Abdominal pain - EGD/colonoscopy.  Janathan Bribiesca D 02/18/2022, 12:06 PM

## 2022-02-18 NOTE — Op Note (Signed)
[facility] Patient Name: Stephanie Mcdaniel Procedure Date: 02/18/2022 MRN: [id number] Attending MD: Carol Ada , MD, [id number] Date of Birth: 1940/02/19 CSN: [id number] Age: 82 Admit Type: Outpatient Procedure:                Upper GI endoscopy Indications:              Periumbilical abdominal pain Providers:                Carol Ada, MD, Adah Perl RN, RN,                            William Dalton, Technician Referring MD:              Medicines:                 Complications:            No immediate complications. Estimated Blood Loss:     Estimated blood loss: none. Procedure:                Pre-Anesthesia Assessment:                           - Prior to the procedure, a History and Physical                            was performed, and patient medications and                            allergies were reviewed. The patient's tolerance of                            previous anesthesia was also reviewed. The risks                            and benefits of the procedure and the sedation                            options and risks were discussed with the patient.                            All questions were answered, and informed consent                            was obtained. Prior Anticoagulants: The patient has                            taken no anticoagulant or antiplatelet agents. ASA                            Grade Assessment: III - A patient with severe                            systemic disease. After reviewing the risks and  benefits, the patient was deemed in satisfactory                            condition to undergo the procedure.                           - Sedation was administered by an anesthesia                            professional. Deep sedation was attained.                           After obtaining informed consent, the endoscope was                            passed under direct vision. Throughout the                             procedure, the patient's blood pressure, pulse, and                            oxygen saturations were monitored continuously. The                            PCF-HQ190L ([id number]) Olympus colonoscope was                            introduced through the mouth, and advanced to the                            second part of duodenum. The upper GI endoscopy was                            accomplished without difficulty. The patient                            tolerated the procedure well. Scope In: Scope Out: Findings:      The esophagus was normal.      The stomach was normal.      The examined duodenum was normal. Impression:               - Normal esophagus.                           - Normal stomach.                           - Normal examined duodenum.                           - No specimens collected. Moderate Sedation:      Not Applicable - Patient had care per Anesthesia. Recommendation:           - Patient has a contact number available for  emergencies. The signs and symptoms of potential                            delayed complications were discussed with the                            patient. Return to normal activities tomorrow.                            Written discharge instructions were provided to the                            patient.                           - Resume previous diet.                           - Continue present medications.                           - Follow up in the office in one month. Procedure Code(s):        --- Professional ---                           [phone], Esophagogastroduodenoscopy, flexible,                            transoral; diagnostic, including collection of                            specimen(s) by brushing or washing, when performed                            (separate procedure) Diagnosis Code(s):        --- Professional ---                           H84.69, Periumbilical  pain CPT copyright 2022 American Medical Association. All rights reserved. The codes documented in this report are preliminary and upon coder review may  be revised to meet current compliance requirements. Carol Ada, MD Carol Ada, MD 02/18/2022 1:28:25 PM This report has been signed electronically. Number of Addenda: 0

## 2022-02-18 NOTE — Anesthesia Preprocedure Evaluation (Addendum)
Anesthesia Evaluation  Patient identified by MRN, date of birth, ID band Patient awake    Reviewed: Allergy & Precautions, NPO status , Patient's Chart, lab work & pertinent test results  Airway Mallampati: II  TM Distance: >3 FB Neck ROM: Full    Dental  (+) Edentulous Upper, Edentulous Lower, Upper Dentures, Lower Dentures, Dental Advisory Given   Pulmonary neg pulmonary ROS   Pulmonary exam normal breath sounds clear to auscultation       Cardiovascular hypertension, Pt. on medications Normal cardiovascular exam Rhythm:Regular Rate:Normal  Cath 2019  1st Mrg lesion, 40 %stenosed.  Ost Cx to Prox Cx lesion, 20 %stenosed.  Prox LAD lesion, 30 %stenosed.  Prox RCA lesion, 20 %stenosed.  Mid RCA lesion, 30 %stenosed.  LV end diastolic pressure is normal.  The left ventricular ejection fraction is 50-55% by visual estimate.  The left ventricular systolic function is normal.    Neuro/Psych negative neurological ROS  negative psych ROS   GI/Hepatic negative GI ROS, Neg liver ROS,,,  Endo/Other  Hypothyroidism  Morbid obesity (BMI 40)  Renal/GU negative Renal ROS  negative genitourinary   Musculoskeletal  (+)  Fibromyalgia -  Abdominal   Peds  Hematology  (+) Blood dyscrasia (plavix)   Anesthesia Other Findings   Reproductive/Obstetrics                             Anesthesia Physical Anesthesia Plan  ASA: 3  Anesthesia Plan: MAC   Post-op Pain Management:    Induction: Intravenous  PONV Risk Score and Plan: Propofol infusion and Treatment may vary due to age or medical condition  Airway Management Planned: Natural Airway  Additional Equipment:   Intra-op Plan:   Post-operative Plan:   Informed Consent: I have reviewed the patients History and Physical, chart, labs and discussed the procedure including the risks, benefits and alternatives for the proposed anesthesia  with the patient or authorized representative who has indicated his/her understanding and acceptance.     Dental advisory given  Plan Discussed with: CRNA  Anesthesia Plan Comments:        Anesthesia Quick Evaluation

## 2022-02-19 LAB — SURGICAL PATHOLOGY

## 2022-02-20 ENCOUNTER — Encounter (HOSPITAL_COMMUNITY): Payer: Self-pay | Admitting: Gastroenterology

## 2022-03-06 ENCOUNTER — Other Ambulatory Visit: Payer: Self-pay | Admitting: Urology

## 2022-03-06 DIAGNOSIS — N281 Cyst of kidney, acquired: Secondary | ICD-10-CM

## 2022-03-29 ENCOUNTER — Ambulatory Visit
Admission: RE | Admit: 2022-03-29 | Discharge: 2022-03-29 | Disposition: A | Discharge status: Home / Self Care | Payer: Medicare Other | Source: Ambulatory Visit | Attending: Urology | Admitting: Urology

## 2022-03-29 DIAGNOSIS — N281 Cyst of kidney, acquired: Secondary | ICD-10-CM

## 2022-03-29 MED ORDER — GADOPICLENOL 0.5 MMOL/ML IV SOLN: 10.0000 mL | Freq: Once | INTRAVENOUS | Status: DC | PRN

## 2022-03-31 ENCOUNTER — Ambulatory Visit
Admission: RE | Admit: 2022-03-31 | Discharge: 2022-03-31 | Disposition: A | Discharge status: Home / Self Care | Payer: Medicare Other | Source: Ambulatory Visit | Attending: Urology | Admitting: Urology

## 2022-03-31 MED ORDER — GADOPICLENOL 0.5 MMOL/ML IV SOLN
10.0000 mL | Freq: Once | INTRAVENOUS | Status: AC | PRN
  Administered 2022-03-31: 10 mL via INTRAVENOUS

## 2022-04-16 ENCOUNTER — Ambulatory Visit: Payer: Medicare Other | Admitting: Podiatry

## 2022-04-16 ENCOUNTER — Encounter: Payer: Self-pay | Admitting: Podiatry

## 2022-04-16 ENCOUNTER — Ambulatory Visit: Payer: Medicare Other

## 2022-04-16 ENCOUNTER — Ambulatory Visit (INDEPENDENT_AMBULATORY_CARE_PROVIDER_SITE_OTHER): Payer: Medicare Other

## 2022-04-16 DIAGNOSIS — M7672 Peroneal tendinitis, left leg: Secondary | ICD-10-CM | POA: Diagnosis not present

## 2022-04-16 NOTE — Progress Notes (Signed)
  Subjective:  Patient ID: Stephanie Mcdaniel, female    DOB: 1940-05-29,  MRN: [id number]  Chief Complaint  Patient presents with   Foot Swelling    np left foot swelling and not painful most of the time, limited ROM when moving up and down and side to side    82 y.o. female presents with the above complaint. History confirmed with patient.  This is been going up for a few weeks now it is making walking difficult and wearing regular shoes difficult.  Objective:  Physical Exam: warm, good capillary refill, no trophic changes or ulcerative lesions, normal DP and PT pulses, normal sensory exam, and pain tenderness and swelling along the peroneal tendons of the left side, pain with resisted eversion.   Radiographs: Multiple views x-ray of the left foot: No arthritic changes noted no acute osseous abnormalities evidence of fracture or dislocation Assessment:   1. Peroneal tendinitis, left      Plan:  Patient was evaluated and treated and all questions answered.  Discussed the etiology and treatment options for peroneal tendinitis including stretching, formal physical therapy with an eccentric exercises therapy plan, supportive shoegears such as a running shoe or sneaker, heel lifts, topical and oral medications.  We also discussed that I do not routinely perform injections in this area because of the risk of an increased damage or rupture of the tendon.  We also discussed the role of surgical treatment of this for patients who do not improve after exhausting non-surgical treatment options.  -XR reviewed with patient -Educated on stretching and icing of the affected limb. -Recommended use of Voltaren gel as needed. -Home therapy plan given. -I recommended utilizing a Tri-Lock ankle brace to support and immobilize the left ankle  Return in about 6 weeks (around 05/28/2022) for re-check peroneal tendinitis.

## 2022-04-16 NOTE — Patient Instructions (Signed)
Look for Voltaren gel at the pharmacy over the counter or online (also known as diclofenac 1% gel). Apply to the painful areas 3-4x daily with the supplied dosing card. Allow to dry for 10 minutes before going into socks/shoes   Peroneal Tendinopathy Rehab Ask your health care provider which exercises are safe for you. Do exercises exactly as told by your health care provider and adjust them as directed. It is normal to feel mild stretching, pulling, tightness, or discomfort as you do these exercises. Stop right away if you feel sudden pain or your pain gets worse. Do not begin these exercises until told by your health care provider. Stretching and range-of-motion exercises These exercises warm up your muscles and joints and improve the movement and flexibility of your ankle. These exercises also help to relieve pain and stiffness. Gastroc and soleus stretch, standing  This is an exercise in which you stand on a step and use your body weight to stretch your calf muscles. To do this exercise: Stand on the edge of a step on the ball of your left / right foot. The ball of your foot is on the walking surface, right under your toes. Keep your other foot firmly on the same step. Hold on to the wall, a railing, or a chair for balance. Slowly lift your other foot, allowing your body weight to press your left / right heel down over the edge of the step. You should feel a stretch in your left / right calf (gastrocnemius and soleus). Hold this position for 15 seconds. Return both feet to the step. Repeat this exercise with a slight bend in your left / right knee. Repeat 5 times with your left / right knee straight and 5 times with your left / right knee bent. Complete this exercise 2 times a day. Strengthening exercises These exercises build strength and endurance in your foot and ankle. Endurance is the ability to use your muscles for a long time, even after they get tired. Ankle dorsiflexion with  band   Secure a rubber exercise band or tube to an object, such as a table leg, that will not move when the band is pulled. Secure the other end of the band around your left / right foot. Sit on the floor, facing the object with your left / right leg extended. The band or tube should be slightly tense when your foot is relaxed. Slowly flex your left / right ankle and toes to bring your foot toward you (dorsiflexion). Hold this position for 15 seconds. Let the band or tube slowly pull your foot back to the starting position. Repeat 5 times. Complete this exercise 2 times a day. Ankle eversion Sit on the floor with your legs straight out in front of you. Loop a rubber exercise band or tube around the ball of your left / right foot. The ball of your foot is on the walking surface, right under your toes. Hold the ends of the band in your hands, or secure the band to a stable object. The band or tube should be slightly tense when your foot is relaxed. Slowly push your foot outward, away from your other leg (eversion). Hold this position for 15 seconds. Slowly return your foot to the starting position. Repeat 5 times. Complete this exercise 2 times a day. Plantar flexion, standing  This exercise is sometimes called standing heel raise. Stand with your feet shoulder-width apart. Place your hands on a wall or table to steady yourself as  needed, but try not to use it for support. Keep your weight spread evenly over the width of your feet while you slowly rise up on your toes (plantar flexion). If told by your health care provider: Shift your weight toward your left / right leg until you feel challenged. Stand on your left / right leg only. Hold this position for 15 seconds. Repeat 2 times. Complete this exercise 2 times a day. Single leg stand Without shoes, stand near a railing or in a doorway. You may hold on to the railing or door frame as needed. Stand on your left / right foot. Keep your  big toe down on the floor and try to keep your arch lifted. Do not roll to the outside of your foot. If this exercise is too easy, you can try it with your eyes closed or while standing on a pillow. Hold this position for 15 seconds. Repeat 5 times. Complete this exercise 2 times a day. This information is not intended to replace advice given to you by your health care provider. Make sure you discuss any questions you have with your health care provider. Document Revised: 05/18/2018 Document Reviewed: 05/18/2018 Else[employer] Patient Education  [place].

## 2022-05-07 ENCOUNTER — Other Ambulatory Visit (HOSPITAL_COMMUNITY): Payer: Self-pay | Admitting: Urology

## 2022-05-07 DIAGNOSIS — N281 Cyst of kidney, acquired: Secondary | ICD-10-CM

## 2022-05-28 ENCOUNTER — Ambulatory Visit: Payer: Medicare Other | Admitting: Podiatry

## 2022-09-08 ENCOUNTER — Ambulatory Visit (INDEPENDENT_AMBULATORY_CARE_PROVIDER_SITE_OTHER): Payer: Medicare Other | Admitting: Orthopaedic Surgery

## 2022-09-08 DIAGNOSIS — M722 Plantar fascial fibromatosis: Secondary | ICD-10-CM | POA: Diagnosis not present

## 2022-09-08 MED ORDER — LIDOCAINE HCL 1 % IJ SOLN
1.0000 mL | INTRAMUSCULAR | Status: AC | PRN
  Administered 2022-09-08: 1 mL

## 2022-09-08 MED ORDER — METHYLPREDNISOLONE ACETATE 40 MG/ML IJ SUSP
40.0000 mg | INTRAMUSCULAR | Status: AC | PRN
  Administered 2022-09-08: 40 mg

## 2022-09-08 NOTE — Progress Notes (Signed)
The patient is an 82 year old female with right heel pain.  She says her first up in the morning is very painful.  She denies any specific injury.  She is hoping to have a steroid injection in this.  She says mostly occurs if she has been sitting for a while and gets up to walk.  She is not diabetic.  She denies any numbness and tingling.  On examination she does have pain over the plantar fascia of her right foot.  Her heel cord is not tight.  Her foot is well-perfused and her sensation is normal.  She has good flexion extension of the ankle.  Her signs and symptoms are consistent with plantars fasciitis.  We talked about shoewear and stretching exercises.  I did agree with her trying a steroid injection and she did tolerate it well.  She can try Voltaren gel topically.  All questions and concerns were answered and addressed.  If this does not get better I would put her in some type of boot.     Procedure Note  Patient: Stephanie Mcdaniel             Date of Birth: [birth year]           MRN: [id number]             Visit Date: 09/08/2022  Procedures: Visit Diagnoses:  1. Plantar fasciitis of right foot     Foot Inj: right plantar fascia  Date/Time: 09/08/2022 10:33 AM  Performed by: Kathryne Hitch, MD Authorized by: Kathryne Hitch, MD   Condition: Plantar Fasciitis   Location: right plantar fascia muscle   Medications:  1 mL lidocaine 1 %; 40 mg methylPREDNISolone acetate 40 MG/ML

## 2022-09-17 ENCOUNTER — Ambulatory Visit (HOSPITAL_COMMUNITY)
Admission: RE | Admit: 2022-09-17 | Discharge: 2022-09-17 | Disposition: A | Discharge status: Home / Self Care | Payer: Medicare Other | Source: Ambulatory Visit | Attending: Urology | Admitting: Urology

## 2022-09-17 DIAGNOSIS — N281 Cyst of kidney, acquired: Secondary | ICD-10-CM | POA: Insufficient documentation

## 2022-09-17 MED ORDER — GADOBUTROL 1 MMOL/ML IV SOLN
10.0000 mL | Freq: Once | INTRAVENOUS | Status: AC | PRN
  Administered 2022-09-17: 10 mL via INTRAVENOUS

## 2022-10-14 ENCOUNTER — Other Ambulatory Visit: Payer: Self-pay | Admitting: Cardiology

## 2022-10-14 DIAGNOSIS — Z1231 Encounter for screening mammogram for malignant neoplasm of breast: Secondary | ICD-10-CM

## 2022-11-18 ENCOUNTER — Ambulatory Visit
Admission: RE | Admit: 2022-11-18 | Discharge: 2022-11-18 | Disposition: A | Discharge status: Home / Self Care | Payer: Medicare Other | Source: Ambulatory Visit | Attending: Cardiology | Admitting: Cardiology

## 2022-11-18 DIAGNOSIS — Z1231 Encounter for screening mammogram for malignant neoplasm of breast: Secondary | ICD-10-CM

## 2023-01-26 ENCOUNTER — Other Ambulatory Visit (HOSPITAL_COMMUNITY): Payer: Self-pay | Admitting: Urology

## 2023-01-26 DIAGNOSIS — N281 Cyst of kidney, acquired: Secondary | ICD-10-CM

## 2023-03-18 ENCOUNTER — Encounter (HOSPITAL_COMMUNITY): Payer: Self-pay | Admitting: Urology

## 2023-03-18 ENCOUNTER — Encounter (HOSPITAL_COMMUNITY): Payer: Self-pay

## 2023-03-18 ENCOUNTER — Ambulatory Visit (HOSPITAL_COMMUNITY): Admission: RE | Admit: 2023-03-18 | Payer: Medicare Other | Source: Ambulatory Visit

## 2023-03-18 ENCOUNTER — Ambulatory Visit (HOSPITAL_COMMUNITY)
Admission: RE | Admit: 2023-03-18 | Discharge: 2023-03-18 | Disposition: A | Discharge status: Home / Self Care | Payer: Medicare Other | Source: Ambulatory Visit | Attending: Urology | Admitting: Urology

## 2023-03-18 DIAGNOSIS — N281 Cyst of kidney, acquired: Secondary | ICD-10-CM | POA: Diagnosis present

## 2023-03-18 MED ORDER — GADOBUTROL 1 MMOL/ML IV SOLN
10.0000 mL | Freq: Once | INTRAVENOUS | Status: AC | PRN
  Administered 2023-03-18: 10 mL via INTRAVENOUS

## 2023-10-23 ENCOUNTER — Other Ambulatory Visit: Payer: Self-pay | Admitting: Cardiology

## 2023-10-23 DIAGNOSIS — Z1231 Encounter for screening mammogram for malignant neoplasm of breast: Secondary | ICD-10-CM

## 2023-11-08 ENCOUNTER — Emergency Department (HOSPITAL_COMMUNITY)

## 2023-11-08 ENCOUNTER — Emergency Department (HOSPITAL_COMMUNITY): Admission: EM | Admit: 2023-11-08 | Discharge: 2023-11-08 | Disposition: A | Discharge status: Home / Self Care

## 2023-11-08 ENCOUNTER — Encounter (HOSPITAL_COMMUNITY): Payer: Self-pay

## 2023-11-08 ENCOUNTER — Other Ambulatory Visit: Payer: Self-pay

## 2023-11-08 DIAGNOSIS — R519 Headache, unspecified: Secondary | ICD-10-CM | POA: Insufficient documentation

## 2023-11-08 DIAGNOSIS — Z7901 Long term (current) use of anticoagulants: Secondary | ICD-10-CM | POA: Insufficient documentation

## 2023-11-08 DIAGNOSIS — R42 Dizziness and giddiness: Secondary | ICD-10-CM | POA: Insufficient documentation

## 2023-11-08 DIAGNOSIS — R110 Nausea: Secondary | ICD-10-CM | POA: Insufficient documentation

## 2023-11-08 LAB — CBC WITH DIFFERENTIAL/PLATELET
Abs Immature Granulocytes: 0.01 K/uL (ref 0.00–0.07)
Basophils Absolute: 0.1 K/uL (ref 0.0–0.1)
Basophils Relative: 1 %
Eosinophils Absolute: 0.1 K/uL (ref 0.0–0.5)
Eosinophils Relative: 1 %
HCT: 37.4 % (ref 36.0–46.0)
Hemoglobin: 11.1 g/dL — ABNORMAL LOW (ref 12.0–15.0)
Immature Granulocytes: 0 %
Lymphocytes Relative: 44 %
Lymphs Abs: 2.7 K/uL (ref 0.7–4.0)
MCH: 27 pg (ref 26.0–34.0)
MCHC: 29.7 g/dL — ABNORMAL LOW (ref 30.0–36.0)
MCV: 91 fL (ref 80.0–100.0)
Monocytes Absolute: 0.4 K/uL (ref 0.1–1.0)
Monocytes Relative: 7 %
Neutro Abs: 2.9 K/uL (ref 1.7–7.7)
Neutrophils Relative %: 47 %
Platelets: 214 K/uL (ref 150–400)
RBC: 4.11 MIL/uL (ref 3.87–5.11)
RDW: 14.7 % (ref 11.5–15.5)
WBC: 6.2 K/uL (ref 4.0–10.5)
nRBC: 0 % (ref 0.0–0.2)

## 2023-11-08 LAB — BASIC METABOLIC PANEL WITH GFR
Anion gap: 12 (ref 5–15)
BUN: 17 mg/dL (ref 8–23)
CO2: 24 mmol/L (ref 22–32)
Calcium: 9.9 mg/dL (ref 8.9–10.3)
Chloride: 104 mmol/L (ref 98–111)
Creatinine, Ser: 0.98 mg/dL (ref 0.44–1.00)
GFR, Estimated: 58 mL/min — ABNORMAL LOW (ref >60)
Glucose, Bld: 113 mg/dL — ABNORMAL HIGH (ref 70–99)
Potassium: 3.8 mmol/L (ref 3.5–5.1)
Sodium: 140 mmol/L (ref 135–145)

## 2023-11-08 LAB — I-STAT CHEM 8, ED
BUN: 19 mg/dL (ref 8–23)
Calcium, Ion: 1.2 mmol/L (ref 1.15–1.40)
Chloride: 105 mmol/L (ref 98–111)
Creatinine, Ser: 1.1 mg/dL — ABNORMAL HIGH (ref 0.44–1.00)
Glucose, Bld: 117 mg/dL — ABNORMAL HIGH (ref 70–99)
HCT: 37 % (ref 36.0–46.0)
Hemoglobin: 12.6 g/dL (ref 12.0–15.0)
Potassium: 4 mmol/L (ref 3.5–5.1)
Sodium: 142 mmol/L (ref 135–145)
TCO2: 26 mmol/L (ref 22–32)

## 2023-11-08 LAB — SEDIMENTATION RATE: Sed Rate: 30 mm/h — ABNORMAL HIGH (ref 0–22)

## 2023-11-08 MED ORDER — ACETAMINOPHEN 500 MG PO TABS
1000.0000 mg | ORAL_TABLET | Freq: Once | ORAL | Status: AC
  Administered 2023-11-08: 1000 mg via ORAL
  Filled 2023-11-08: qty 2

## 2023-11-08 MED ORDER — PROCHLORPERAZINE EDISYLATE 10 MG/2ML IJ SOLN
5.0000 mg | Freq: Once | INTRAMUSCULAR | Status: DC
  Filled 2023-11-08: qty 2

## 2023-11-08 MED ORDER — MECLIZINE HCL 25 MG PO TABS
25.0000 mg | ORAL_TABLET | Freq: Once | ORAL | Status: AC
  Administered 2023-11-08: 25 mg via ORAL
  Filled 2023-11-08: qty 1

## 2023-11-08 MED ORDER — ONDANSETRON HCL 4 MG/2ML IJ SOLN
4.0000 mg | Freq: Once | INTRAMUSCULAR | Status: AC
  Administered 2023-11-08: 4 mg via INTRAVENOUS
  Filled 2023-11-08: qty 2

## 2023-11-08 MED ORDER — MECLIZINE HCL 25 MG PO TABS: 25.0000 mg | ORAL_TABLET | Freq: Three times a day (TID) | ORAL | 0 refills | Status: AC | PRN

## 2023-11-08 NOTE — ED Triage Notes (Signed)
 Patient reports headache, dizziness, stumbling, emesis, since Wednesday, PCP on Friday told her to come if it got worse. Patient reports episode where she had to sit on the edge of the tub after shower and threw up and got too hot. Reports headache is worse now, remains in front of her head.

## 2023-11-08 NOTE — ED Notes (Signed)
 Ambulated with patient no complaints of dizziness, or SOB.

## 2023-11-08 NOTE — Discharge Instructions (Addendum)
 Your workup was reassuring.  Please take 1000mg  tylenol  every 6 hours as needed for pain or headache.  Please take the meclizine as needed for nausea.  Please return to the ER for fever, vision changes, worsening headache, weakness, numbness, tingling, or other concerning symptoms.

## 2023-11-08 NOTE — ED Provider Notes (Signed)
 [place] EMERGENCY DEPARTMENT AT [facility] Provider Note   CSN: [id number] Arrival date & time: 11/08/23  1527     Patient presents with: Dizziness   Stephanie Mcdaniel is a 83 y.o. female.   83 year old female with past medical history of migraines in her 5s as well as vertigo in the past presenting to the emergency department today with headache, dizziness, and vomiting.  This been going on now for the past few days.  The patient was seen by her primary care doctor and was told to come to the ER if symptoms worsen.  She reports the symptoms were little worse today.  She states that she has been having a mild headache behind her eyes over the past few days which is new for her as well.  Denies any jaw claudication or fevers.  She states that she normally does not get headaches these days.  She came to the ER further evaluation.   Dizziness      Prior to Admission medications   Medication Sig Start Date End Date Taking? Authorizing Provider  meclizine (ANTIVERT) 25 MG tablet Take 1 tablet (25 mg total) by mouth 3 (three) times daily as needed for dizziness. 11/08/23  Yes Ula Prentice SAUNDERS, MD  acetaminophen  (TYLENOL ) 650 MG CR tablet Take 1,300 mg by mouth every 8 (eight) hours as needed for pain.    [provider]  Ascorbic Acid  (VITAMIN C ) 1000 MG tablet Take 1,000 mg by mouth daily.    [provider]  clopidogrel  (PLAVIX ) 75 MG tablet Take 75 mg by mouth daily.    [provider]  levothyroxine  (SYNTHROID ) 75 MCG tablet Take 75 mcg by mouth daily before breakfast.    [provider]  nitroGLYCERIN (NITROSTAT) 0.4 MG SL tablet Place 0.4 mg under the tongue every 5 (five) minutes as needed for chest pain.  02/13/16   [provider]  Olmesartan -Amlodipine -HCTZ (TRIBENZOR) 40-10-25 MG TABS Take 1 tablet by mouth daily.    [provider]  oxybutynin (DITROPAN-XL) 5 MG 24 hr tablet Take 5 mg by mouth daily. 02/17/22   [provider]  pantoprazole  (PROTONIX ) 40 MG tablet Take 40 mg by mouth daily.    [provider]  Plecanatide (TRULANCE) 3 MG TABS 1 tab(s) orally once a day for 90 days 04/11/22   [provider]  Polyethyl Glycol-Propyl Glycol (SYSTANE OP) Place 1 drop into both eyes daily as needed (dry eyes).    [provider]  rosuvastatin  (CRESTOR ) 20 MG tablet Take 20 mg by mouth at bedtime.    [provider]    Allergies: Aspirin, Contrast media [iodinated contrast media], Motrin [ibuprofen], and Codeine    Review of Systems  Neurological:  Positive for dizziness.    Updated Vital Signs BP (!) 159/72   Pulse (!) 54   Temp 98.7 F (37.1 C)   Resp 16   SpO2 100%   Physical Exam Vitals and nursing note reviewed.   Gen: NAD Eyes: PERRL, EOMI, left going nystagmus noted, no vertical nystagmus noted HEENT: no oropharyngeal swelling Neck: trachea midline Resp: clear to auscultation bilaterally Card: RRR, no murmurs, rubs, or gallops Abd: nontender, nondistended Extremities: no calf tenderness, no edema Vascular: 2+ radial pulses bilaterally, 2+ DP pulses bilaterally Neuro: cranial nerves intact, equal strength and sensation throughout bilateral upper and lower extremities, no dysmetria on finger-nose testing Skin: no rashes Psyc: acting appropriately   (all labs ordered are listed, but only abnormal  results are displayed) Labs Reviewed  BASIC METABOLIC PANEL WITH GFR - Abnormal; Notable for the following components:      Result Value   Glucose, Bld 113 (*)    GFR, Estimated 58 (*)    All other components within normal limits  CBC WITH DIFFERENTIAL/PLATELET - Abnormal; Notable for the following components:   Hemoglobin 11.1 (*)    MCHC 29.7 (*)    All other components within normal limits  SEDIMENTATION RATE - Abnormal; Notable for the following components:   Sed Rate 30 (*)    All other components within normal limits  I-STAT CHEM 8, ED -  Abnormal; Notable for the following components:   Creatinine, Ser 1.10 (*)    Glucose, Bld 117 (*)    All other components within normal limits    EKG: None  Radiology: CT HEAD WO CONTRAST Result Date: 11/08/2023 EXAM: CT HEAD WITHOUT CONTRAST 11/08/2023 05:53:22 PM TECHNIQUE: CT of the head was performed without the administration of intravenous contrast. Automated exposure control, iterative reconstruction, and/or weight based adjustment of the mA/kV was utilized to reduce the radiation dose to as low as reasonably achievable. COMPARISON: Prior study. CLINICAL HISTORY: Headache, new onset (Age >= 51y). Patient reports headache, dizziness, stumbling, and emesis since Wednesday, with worsening headache, particularly in the front of her head. PCP advised to come if symptoms worsened. FINDINGS: BRAIN AND VENTRICLES: Scattered subcortical right matter hypoattenuation is noted with some progression since the prior study, consistent with chronic ischemic changes. Atherosclerotic calcifications are present in the cavernous carotid arteries bilaterally. No hyperdense vessel is present. No acute hemorrhage. No evidence of acute infarct. No hydrocephalus. No extra-axial collection. No mass effect or midline shift. ORBITS: Bilateral lens replacements are noted. The globes and orbits are otherwise within normal limits. SINUSES: No acute abnormality. SOFT TISSUES AND SKULL: No acute soft tissue abnormality. No skull fracture. IMPRESSION: 1. Progression of scattered subcortical right white matter hypoattenuation, suggestive of chronic ischemia. 2. No acute findings. 3. Atherosclerotic calcifications in the cavernous carotid arteries bilaterally. 4. No hyperdense vessel identified. Electronically signed by: Lonni Necessary MD 11/08/2023 06:20 PM EDT RP Workstation: [id number]     Procedures   Medications Ordered in the ED  prochlorperazine (COMPAZINE) injection 5 mg (5 mg Intravenous Not Given 11/08/23 1636)   ondansetron  (ZOFRAN ) injection 4 mg (4 mg Intravenous Given 11/08/23 1630)  meclizine (ANTIVERT) tablet 25 mg (25 mg Oral Given 11/08/23 1940)  acetaminophen  (TYLENOL ) tablet 1,000 mg (1,000 mg Oral Given 11/08/23 1946)                                    Medical Decision Making 83 year old female with past medical history of vertigo and remote history of migraine headaches presenting to the emergency department today with headache, nausea, and dizziness.  This been going now for the past few days.  I will further evaluate here with basic labs Wels and ESR to evaluate for temporal arteritis.  Will obtain an an EKG as well and keep patient on the monitor to to evaluate for arrhythmias.  Will obtain basic labs to evaluate for anemia or electrolyte abnormalities as well as a CT scan of her head to evaluate for intracranial hemorrhage or mass lesion.  I will reevaluate for ultimate disposition.  Will give her Compazine as well as meclizine to see if helps with her symptoms.  The patient CT scan is negative for any  acute findings but there are some chronic changes noted.  Labs are reassuring.  The patient's dizziness resolved with the meclizine.  Her exam is consistent with vertigo.  She was still having a headache on reassessment after Tylenol .  I did call and discussed this with Dr. Vanessa from neurology.  With her ESR being only mildly elevated and with no classic symptoms of temporal arteritis recommends outpatient follow-up.  The patient is discharged with return precautions.  Risk OTC drugs. Prescription drug management.        Final diagnoses:  Dizziness    ED Discharge Orders          Ordered    meclizine (ANTIVERT) 25 MG tablet  3 times daily PRN        11/08/23 2207               Ula Prentice SAUNDERS, MD 11/08/23 2209

## 2023-11-08 NOTE — ED Provider Triage Note (Signed)
 Emergency Medicine Provider Triage Evaluation Note  Stephanie Mcdaniel , a 82 y.o. female  was evaluated in triage.  Pt complains of frontal headache, dizziness, emesis.  Seen at primary care for same, informed to go to ED if things worsen which they indeed have over the last several days.  Initially seen at PCP 4 days prior.  States that she has had episodes where she has had dizziness to the degree where she moved her head she had onset of emesis.  Previous history of BPPV however she states that symptoms have not been consistent with previous episodes.  Headache is primarily frontal, states that she also has pain behind the eyes.  Review of Systems  Positive: As above Negative:   Physical Exam  BP (!) 147/68   Pulse 61   Temp 98.5 F (36.9 C)   Resp 18   SpO2 98%  Gen:   Awake, no distress   Resp:  Normal effort  MSK:   Moves extremities without difficulty  Other:  No changes in peripheral vision, neuroexam is largely unremarkable.  Medical Decision Making  Medically screening exam initiated at 3:56 PM.  Appropriate orders placed.  Stephanie Mcdaniel was informed that the remainder of the evaluation will be completed by another provider, this initial triage assessment does not replace that evaluation, and the importance of remaining in the ED until their evaluation is complete.  Initial orders for headache/dizziness placed.  Also provided Zofran  for nausea.   Stephanie Mcdaniel, Stephanie Mcdaniel 11/08/23 [id number]

## 2023-11-08 NOTE — ED Triage Notes (Signed)
 PT states frontal headache and intermittent dizziness since Wed. Was seen by pcp on Thursday who stated to come to ED if symptoms became worse.

## 2023-11-20 ENCOUNTER — Ambulatory Visit
Admission: RE | Admit: 2023-11-20 | Discharge: 2023-11-20 | Disposition: A | Discharge status: Home / Self Care | Source: Ambulatory Visit | Attending: Cardiology | Admitting: Cardiology

## 2023-11-20 DIAGNOSIS — Z1231 Encounter for screening mammogram for malignant neoplasm of breast: Secondary | ICD-10-CM

## 2024-01-25 ENCOUNTER — Other Ambulatory Visit: Payer: Self-pay | Admitting: Urology

## 2024-01-25 DIAGNOSIS — N281 Cyst of kidney, acquired: Secondary | ICD-10-CM

## 2024-03-17 ENCOUNTER — Other Ambulatory Visit

## 2024-03-22 ENCOUNTER — Other Ambulatory Visit
